# Patient Record
Sex: Male | Born: 1985 | Race: White | Hispanic: No | Smoking: Former smoker
Health system: Southern US, Community
[De-identification: ages and names within clinical notes are randomized; demographics above are authoritative.]

## PROBLEM LIST (undated history)

## (undated) DIAGNOSIS — T7840XA Allergy, unspecified, initial encounter: Secondary | ICD-10-CM

## (undated) HISTORY — DX: Allergy, unspecified, initial encounter: T78.40XA

## (undated) HISTORY — PX: COSMETIC SURGERY: SHX468

---

## 2010-12-23 ENCOUNTER — Ambulatory Visit: Payer: BC Managed Care – PPO

## 2010-12-23 ENCOUNTER — Ambulatory Visit (INDEPENDENT_AMBULATORY_CARE_PROVIDER_SITE_OTHER): Payer: BC Managed Care – PPO

## 2010-12-23 DIAGNOSIS — Z7251 High risk heterosexual behavior: Secondary | ICD-10-CM

## 2011-06-28 ENCOUNTER — Telehealth: Payer: Self-pay

## 2011-06-28 ENCOUNTER — Ambulatory Visit (INDEPENDENT_AMBULATORY_CARE_PROVIDER_SITE_OTHER): Payer: BC Managed Care – PPO | Admitting: Family Medicine

## 2011-06-28 VITALS — BP 114/70 | HR 60 | Temp 98.5°F | Resp 16 | Ht 74.5 in | Wt 170.4 lb

## 2011-06-28 DIAGNOSIS — H9202 Otalgia, left ear: Secondary | ICD-10-CM

## 2011-06-28 DIAGNOSIS — H6090 Unspecified otitis externa, unspecified ear: Secondary | ICD-10-CM

## 2011-06-28 DIAGNOSIS — Z113 Encounter for screening for infections with a predominantly sexual mode of transmission: Secondary | ICD-10-CM

## 2011-06-28 DIAGNOSIS — R221 Localized swelling, mass and lump, neck: Secondary | ICD-10-CM

## 2011-06-28 DIAGNOSIS — R220 Localized swelling, mass and lump, head: Secondary | ICD-10-CM

## 2011-06-28 DIAGNOSIS — H60399 Other infective otitis externa, unspecified ear: Secondary | ICD-10-CM

## 2011-06-28 MED ORDER — NEOMYCIN-COLIST-HC-THONZONIUM 3.3-3-10-0.5 MG/ML OT SUSP: 3.0000 [drp] | Freq: Four times a day (QID) | OTIC | Status: AC

## 2011-06-28 NOTE — Progress Notes (Signed)
This is a 26 year old man who comes in having done some river sports in the [place] followed by hiking in the [facility]. Subsequent to these activities, he developed swelling just anterior to the left ear with ear pain radiating down his neck. He's had no change in hearing or tinnitus.  Patient also was requesting STD screening. He likes to get tested every 6 months or so even though he does use protection. He has no rash or dysuria or fevers.  Objective: Mild swelling in the pretragal area of the left face which is tender as well. Examination of ear canal shows redness and some scaling, TMs are normal Oropharynx is clear of any erythema or lesions. Neck: Supple with half centimeter node at the angle of the left jaw.  Assessment: This could be months or simply otitis externa with adenopathy. STD screening will be carried out.  Plan: 1. Otitis externa  neomycin-colistin-hydrocortisone-thonzonium (CORTISPORIN-TC) 3.03-06-08-0.5 MG/ML otic suspension, Mumps antibody, IgM  2. Screening examination for venereal disease  GC/chlamydia probe amp, urine, HIV antibody  3. Otalgia of left ear    4. Left facial swelling  Mumps antibody, IgM   Recheck 48 hours if not improving

## 2011-06-28 NOTE — Telephone Encounter (Signed)
Patient saw Dr. Elbert Ewings today and he called her in cortisporin.  Pharmacy says there is not a generic for this and the patient's copay is $50.  Could we please call in something different?

## 2011-06-29 LAB — GC/CHLAMYDIA PROBE AMP, URINE
Chlamydia, Swab/Urine, PCR: NEGATIVE
GC Probe Amp, Urine: NEGATIVE

## 2011-06-29 LAB — MUMPS ANTIBODY, IGM: Mumps IgM Value: <1:20 {titer}

## 2011-06-29 LAB — HIV ANTIBODY (ROUTINE TESTING W REFLEX): HIV: NONREACTIVE

## 2011-07-25 ENCOUNTER — Ambulatory Visit (INDEPENDENT_AMBULATORY_CARE_PROVIDER_SITE_OTHER): Payer: BC Managed Care – PPO | Admitting: Family Medicine

## 2011-07-25 VITALS — BP 102/66 | HR 84 | Temp 97.6°F | Resp 16 | Ht 74.18 in | Wt 169.6 lb

## 2011-07-25 DIAGNOSIS — H60399 Other infective otitis externa, unspecified ear: Secondary | ICD-10-CM

## 2011-07-25 DIAGNOSIS — H6090 Unspecified otitis externa, unspecified ear: Secondary | ICD-10-CM

## 2011-07-25 DIAGNOSIS — H938X9 Other specified disorders of ear, unspecified ear: Secondary | ICD-10-CM

## 2011-07-25 MED ORDER — HYDROCORTISONE-ACETIC ACID 1-2 % OT SOLN: 4.0000 [drp] | Freq: Two times a day (BID) | OTIC | Status: AC

## 2011-07-25 NOTE — Progress Notes (Signed)
  [facility]:  Office Visit  Chief Complaint:  Chief Complaint  Patient presents with  . Otalgia    recurrent  left ear    HPI: TAHMID Gibson is a 26 y.o. male who complains of left ear fullness.  Was on abx for external ear infection. Denies ear pain except when burping. Denies any URI sxs. Has tried q-tips and peroxide to ear which did not seem to make it feel better. He also put some other drops accidently in his ear thinking it was his abx drops. Denies fevers, chills   Past Medical History  Diagnosis Date  . Allergy    History reviewed. No pertinent past surgical history. History   Social History  . Marital Status: Single    Spouse Name: N/A    Number of Children: N/A  . Years of Education: N/A   Social History Main Topics  . Smoking status: Former Games developer  . Smokeless tobacco: None  . Alcohol Use: Yes  . Drug Use: No  . Sexually Active: None   Other Topics Concern  . None   Social History Narrative  . None   No family history on file. Allergies  Allergen Reactions  . Sulfa Antibiotics Rash   Prior to Admission medications   Not on File     ROS: The patient denies fevers, chills, night sweats, unintentional weight loss, chest pain, palpitations, wheezing, dyspnea on exertion, nausea, vomiting, abdominal pain, dysuria, hematuria, melena, numbness, weakness, or tingling.   All other systems have been reviewed and were otherwise negative with the exception of those mentioned in the HPI and as above.    PHYSICAL EXAM: Filed Vitals:   07/25/11 1154  BP: 102/66  Pulse: 84  Temp: 97.6 F (36.4 C)  Resp: 16   Filed Vitals:   07/25/11 1154  Height: 6' 2.18" (1.884 m)  Weight: 169 lb 9.6 oz (76.93 kg)   Body mass index is 21.67 kg/(m^2).  General: Alert, no acute distress HEENT:  Normocephalic, atraumatic, oropharynx patent. Left ear: Tm intact, + MInimal redness external ear canal, + scarring, minimal fluid ?  peroxide Cardiovascular:  Regular rate and rhythm, no rubs murmurs or gallops.  No Carotid bruits, radial pulse intact. No pedal edema.  Respiratory: Clear to auscultation bilaterally.  No wheezes, rales, or rhonchi.  No cyanosis, no use of accessory musculature GI: No organomegaly, abdomen is soft and non-tender, positive bowel sounds.  No masses. Skin: No rashes. Neurologic: Facial musculature symmetric. Psychiatric: Patient is appropriate throughout our interaction. Lymphatic: No cervical lymphadenopathy Musculoskeletal: Gait intact.   LABS: Results for orders placed in visit on 06/28/11  GC/CHLAMYDIA PROBE AMP, URINE      Component Value Range   Chlamydia, Swab/Urine, PCR NEGATIVE  NEGATIVE   GC Probe Amp, Urine NEGATIVE  NEGATIVE  HIV ANTIBODY (ROUTINE TESTING)      Component Value Range   HIV NON REACTIVE  NON REACTIVE  MUMPS ANTIBODY, IGM      Component Value Range   Mumps IgM Value <1:20  <1:20 Index     EKG/XRAY:   Primary read interpreted by Dr. Conley Rolls at [facility].   ASSESSMENT/PLAN: Encounter Diagnoses  Name Primary?  . Sensation of fullness in ear Yes  . Otitis externa    Vosol Rx If no improvement then use Otic abx that was given to him prior.     Hamilton Capri PHUONG, DO 07/25/2011 12:06 PM

## 2011-10-05 ENCOUNTER — Ambulatory Visit (INDEPENDENT_AMBULATORY_CARE_PROVIDER_SITE_OTHER): Payer: BC Managed Care – PPO | Admitting: Physician Assistant

## 2011-10-05 VITALS — BP 116/84 | HR 89 | Temp 97.9°F | Resp 16 | Ht 74.0 in | Wt 166.0 lb

## 2011-10-05 DIAGNOSIS — Z9189 Other specified personal risk factors, not elsewhere classified: Secondary | ICD-10-CM

## 2011-10-05 DIAGNOSIS — Z23 Encounter for immunization: Secondary | ICD-10-CM

## 2011-10-05 DIAGNOSIS — Z202 Contact with and (suspected) exposure to infections with a predominantly sexual mode of transmission: Secondary | ICD-10-CM

## 2011-10-05 NOTE — Progress Notes (Signed)
  Subjective:    Patient ID: Dominic Gibson, male    DOB: May 20, 1985, 26 y.o.   MRN: [id number]  HPI  Dominic Gibson is a 26 yr old male here today for STI testing.  He has a new girlfriend.  She is his only sexual partner.  They were "safe in the beginning" but have been having unprotected sex since she has an IUD, and they don't have to worry about pregnancy.  He and his girlfriend were tested previously, but at that time he was not test for HIV or HSV.  Today he would like those tests as well as a full STI panel.  He denies any discharge, pain, or urinary symptoms.  He does state that he has a few small red, raised bumps on the shaft of his penis.  He believes these are just hair follicles but wants to be sure. He does not have a history of cold sores.  He may have been exposed to HSV from a previous girlfriend.   Review of Systems  Constitutional: Negative for fever, chills and fatigue.  HENT: Negative.   Eyes: Negative.   Respiratory: Negative.   Cardiovascular: Negative.   Gastrointestinal: Negative.   Genitourinary: Negative for dysuria, urgency, hematuria, discharge, difficulty urinating and testicular pain.       Objective:   Physical Exam  Constitutional: He appears well-developed and well-nourished. No distress.  Genitourinary: Penis normal. No penile erythema. No discharge found.       There are several fine, papules on either side of the shaft.  No erythema, no vessicles          Assessment & Plan:   1. Possible exposure to STD  GC/chlamydia probe amp, urine, HIV antibody, HSV(herpes simplex vrs) 1+2 ab-IgG, RPR   Mr. Dominic Gibson is a 26 yr old male here for STI screening.  He has no symptoms at this time.  He has noted some very fine papules on the side of penis.  These do not appear to be infectious.  There are no lesions to culture.  These are likely irritated hair follicles as the patient has recently trimmed/shaved.  At this time we will do a uriprobe, HIV, RPR, and HSV.  We  discussed that the results from HSV IgG/IgM will only tell us if he has ever been exposed but cannot tell us when or by whom.  The patient understands and wants to proceed with the test.  Counseled patient on condom use as a means of preventing STI.    I have reviewed the chart and agree with the treatment plan.  Rhoderick Moody, PA-C

## 2011-10-06 LAB — HSV(HERPES SIMPLEX VRS) I + II AB-IGG
HSV 1 Glycoprotein G Ab, IgG: <0.1 IV
HSV 2 Glycoprotein G Ab, IgG: <0.1 IV

## 2011-10-06 LAB — RPR

## 2011-10-06 LAB — GC/CHLAMYDIA PROBE AMP, URINE: Chlamydia, Swab/Urine, PCR: NEGATIVE

## 2011-10-07 ENCOUNTER — Telehealth: Payer: Self-pay

## 2011-10-07 NOTE — Telephone Encounter (Signed)
Patient advised labs negative

## 2011-10-07 NOTE — Telephone Encounter (Signed)
PT STATES HE RECEIVED HIS TEST RESULTS AND HAVE QUESTIONS ABOUT IT. PLEASE CALL [phone]

## 2011-10-08 ENCOUNTER — Telehealth: Payer: Self-pay | Admitting: Family Medicine

## 2011-10-08 NOTE — Telephone Encounter (Signed)
Spoke with patient explain his labs to him

## 2012-11-09 ENCOUNTER — Other Ambulatory Visit: Payer: Self-pay

## 2013-08-10 ENCOUNTER — Ambulatory Visit (INDEPENDENT_AMBULATORY_CARE_PROVIDER_SITE_OTHER): Payer: BC Managed Care – PPO | Admitting: Family Medicine

## 2013-08-10 VITALS — BP 110/70 | HR 58 | Temp 97.8°F | Resp 16 | Ht 74.0 in | Wt 177.0 lb

## 2013-08-10 DIAGNOSIS — Z0289 Encounter for other administrative examinations: Secondary | ICD-10-CM

## 2013-08-10 DIAGNOSIS — Z131 Encounter for screening for diabetes mellitus: Secondary | ICD-10-CM

## 2013-08-10 DIAGNOSIS — Z0000 Encounter for general adult medical examination without abnormal findings: Secondary | ICD-10-CM

## 2013-08-10 DIAGNOSIS — Z130 Encounter for screening for diseases of the blood and blood-forming organs and certain disorders involving the immune mechanism: Secondary | ICD-10-CM

## 2013-08-10 LAB — POCT CBC
Granulocyte percent: 60.6 %G (ref 37–80)
HEMATOCRIT: 47.1 % (ref 43.5–53.7)
Hemoglobin: 15.8 g/dL (ref 14.1–18.1)
LYMPH, POC: 1.7 (ref 0.6–3.4)
MCH: 30.7 pg (ref 27–31.2)
MCHC: 33.5 g/dL (ref 31.8–35.4)
MCV: 91.6 fL (ref 80–97)
MID (cbc): 0.4 (ref 0–0.9)
MPV: 8.4 fL (ref 0–99.8)
POC Granulocyte: 3.3 (ref 2–6.9)
POC LYMPH %: 31.5 % (ref 10–50)
POC MID %: 7.9 %M (ref 0–12)
Platelet Count, POC: 170 10*3/uL (ref 142–424)
RBC: 5.14 M/uL (ref 4.69–6.13)
RDW, POC: 13.4 %
WBC: 5.5 10*3/uL (ref 4.6–10.2)

## 2013-08-10 LAB — POCT URINALYSIS DIPSTICK
Bilirubin, UA: NEGATIVE
Glucose, UA: NEGATIVE
KETONES UA: NEGATIVE
LEUKOCYTES UA: NEGATIVE
Nitrite, UA: NEGATIVE
PH UA: 7
Protein, UA: NEGATIVE
RBC UA: NEGATIVE
Spec Grav, UA: 1.02
Urobilinogen, UA: 1

## 2013-08-10 LAB — RPR

## 2013-08-10 LAB — HIV ANTIBODY (ROUTINE TESTING W REFLEX): HIV 1&2 Ab, 4th Generation: NONREACTIVE

## 2013-08-10 NOTE — Progress Notes (Signed)
   Subjective:    Patient ID: Dominic Gibson, male    DOB: October 09, 1985, 28 y.o.   MRN: [id number]  HPI  Tuberculosis Risk Questionnaire  1. No Were you born outside the [place] in one of the following parts of the world: [place], [place], [place], [place] or [place]?    2. Yes [place] in [place] days about 3 years ago; Have you traveled outside the [place] and lived for more than one month in one of the following parts of the world: [place], [place], [place], [place] or [place]?    3. No Do you have a compromised immune system such as from any of the following conditions:HIV/AIDS, organ or bone marrow transplantation, diabetes, immunosuppressive medicines (e.g. Prednisone, Remicaide), leukemia, lymphoma, cancer of the head or neck, gastrectomy or jejunal bypass, end-stage renal disease (on dialysis), or silicosis?     4. No Have you ever or do you plan on working in: a residential care center, a health care facility, a jail or prison or homeless shelter?    5. No Have you ever: injected illegal drugs, used crack cocaine, lived in a homeless shelter  or been in jail or prison?     6. No Have you ever been exposed to anyone with infectious tuberculosis?    Tuberculosis Symptom Questionnaire  Do you currently have any of the following symptoms?  1. No Unexplained cough lasting more than 3 weeks?   2. No Unexplained fever lasting more than 3 weeks.   3. No Night Sweats (sweating that leaves the bedclothes and sheets wet)     4. No Shortness of Breath   5. No Chest Pain   6. No Unintentional weight loss    7. No Unexplained fatigue (very tired for no reason)     Review of Systems     Objective:   Physical Exam        Assessment & Plan:

## 2013-08-10 NOTE — Progress Notes (Signed)
   Subjective:    Patient ID: Dominic Gibson, male    DOB: Apr 11, 1985, 28 y.o.   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  Past Medical History  Diagnosis Date  . Allergy    Past Surgical History  Procedure Laterality Date  . Cosmetic surgery     Family History  Problem Relation Age of Onset  . Dementia Maternal Grandmother   . Parkinsonism Maternal Grandfather    History  Substance Use Topics  . Smoking status: Former Games developermoker  . Smokeless tobacco: Not on file  . Alcohol Use: Yes   Patient requests STI testing today.  Review of Systems  Allergic/Immunologic: Positive for environmental allergies and food allergies.  All other systems reviewed and are negative.      Objective:   Physical Exam  Vitals reviewed. Constitutional: He is oriented to person, place, and time. He appears well-developed and well-nourished. No distress.  HENT:  Head: Normocephalic and atraumatic.  Right Ear: Tympanic membrane, external ear and ear canal normal.  Left Ear: Tympanic membrane, external ear and ear canal normal.  Nose: Nose normal.  Mouth/Throat: Oropharynx is clear and moist. No oropharyngeal exudate.  Eyes: Conjunctivae and EOM are normal. Pupils are equal, round, and reactive to light. Right eye exhibits no discharge. Left eye exhibits no discharge.  Neck: Normal range of motion. Neck supple. No JVD present. No thyromegaly present.  Cardiovascular: Normal rate, regular rhythm and normal heart sounds.   Pulmonary/Chest: Effort normal and breath sounds normal.  Abdominal: Soft. Bowel sounds are normal. He exhibits no distension and no mass. There is no tenderness. There is no rebound and no guarding. Hernia confirmed negative in the right inguinal area and confirmed negative in the left inguinal area.  Genitourinary: Testes normal and penis normal. Right testis shows no  mass, no swelling and no tenderness. Right testis is descended. Left testis shows no mass, no swelling and no tenderness. Left testis is descended. Circumcised. No penile tenderness.  Musculoskeletal: Normal range of motion.  Lymphadenopathy:    He has no cervical adenopathy.       Right: No inguinal adenopathy present.       Left: No inguinal adenopathy present.  Neurological: He is alert and oriented to person, place, and time. He has normal reflexes.  Skin: Skin is warm and dry. He is not diaphoretic.  Psychiatric: He has a normal mood and affect. His behavior is normal. Judgment and thought content normal.      Assessment & Plan:  1. School physical exam - TB Skin Test - HIV antibody - RPR - GC/Chlamydia Probe Amp  2. Screening for deficiency anemia - POCT CBC  3. Encounter for screening examination for impaired glucose regulation and diabetes mellitus - POCT urinalysis dipstick   Emi Belfasteborah B. Gessner, FNP-BC  [facility], [facility]  08/10/2013 4:35 PM

## 2013-08-11 LAB — GC/CHLAMYDIA PROBE AMP
CT PROBE, AMP APTIMA: NEGATIVE
GC Probe RNA: NEGATIVE

## 2013-08-13 ENCOUNTER — Ambulatory Visit (INDEPENDENT_AMBULATORY_CARE_PROVIDER_SITE_OTHER): Payer: Self-pay | Admitting: *Deleted

## 2013-08-13 DIAGNOSIS — Z111 Encounter for screening for respiratory tuberculosis: Secondary | ICD-10-CM

## 2013-08-13 LAB — TB SKIN TEST: TB SKIN TEST: NEGATIVE

## 2013-08-13 NOTE — Progress Notes (Signed)
Patient here for TB read. Results negative. Patient told to come in at 11.14 am got here at 12.14pm. Ok per Porfirio Oar

## 2013-11-28 ENCOUNTER — Telehealth: Payer: Self-pay | Admitting: Family Medicine

## 2013-11-28 ENCOUNTER — Other Ambulatory Visit: Payer: Self-pay | Admitting: Radiology

## 2013-11-28 DIAGNOSIS — Z7185 Encounter for immunization safety counseling: Secondary | ICD-10-CM

## 2013-11-28 DIAGNOSIS — Z111 Encounter for screening for respiratory tuberculosis: Secondary | ICD-10-CM

## 2013-11-28 DIAGNOSIS — Z7189 Other specified counseling: Secondary | ICD-10-CM

## 2013-11-28 NOTE — Telephone Encounter (Signed)
Patients papers were reviewed he needs TB skin test and Varicella titer. Orders placed, patient called, advised. He will come in on Friday. Eunice Blase has the papers (in her drawer)

## 2013-11-28 NOTE — Telephone Encounter (Signed)
Patient dropped off form to be completed by Chad Cordial. He needs information from his physical form transcribed to another form. Please complete and call patient when ready for pick up. These papers were dropped off at [address] and given to one of the medical assistants   Thanks!

## 2013-11-28 NOTE — Telephone Encounter (Signed)
Just an FYI

## 2013-12-03 ENCOUNTER — Ambulatory Visit (INDEPENDENT_AMBULATORY_CARE_PROVIDER_SITE_OTHER): Payer: BC Managed Care – PPO | Admitting: Emergency Medicine

## 2013-12-03 ENCOUNTER — Telehealth: Payer: Self-pay | Admitting: Radiology

## 2013-12-03 VITALS — BP 112/78 | HR 83 | Temp 97.9°F | Resp 18 | Ht 74.25 in | Wt 180.0 lb

## 2013-12-03 DIAGNOSIS — Z111 Encounter for screening for respiratory tuberculosis: Secondary | ICD-10-CM

## 2013-12-03 NOTE — Telephone Encounter (Signed)
Patient is here now to see Dr Dareen Piano, I have given patient packet to patient, it was in your drawer, Dominic Gibson

## 2013-12-03 NOTE — Patient Instructions (Signed)

## 2013-12-03 NOTE — Progress Notes (Signed)
Lab only 

## 2013-12-03 NOTE — Progress Notes (Signed)
   Tuberculosis Risk Questionnaire  1. No Were you born outside the [place] in one of the following parts of the world: [place], [place], [place], [place] or [place]?    2. Yes- [place]- 45 days. Have you traveled outside the [place] and lived for more than one month in one of the following parts of the world: [place], [place], [place], [place] or [place]?    3. No Do you have a compromised immune system such as from any of the following conditions:HIV/AIDS, organ or bone marrow transplantation, diabetes, immunosuppressive medicines (e.g. Prednisone, Remicaide), leukemia, lymphoma, cancer of the head or neck, gastrectomy or jejunal bypass, end-stage renal disease (on dialysis), or silicosis?     4. No Have you ever or do you plan on working in: a residential care center, a health care facility, a jail or prison or homeless shelter?    5. No Have you ever: injected illegal drugs, used crack cocaine, lived in a homeless shelter  or been in jail or prison?     6. No Have you ever been exposed to anyone with infectious tuberculosis?    Tuberculosis Symptom Questionnaire  Do you currently have any of the following symptoms?  1. No Unexplained cough lasting more than 3 weeks?   2. No Unexplained fever lasting more than 3 weeks.   3. No Night Sweats (sweating that leaves the bedclothes and sheets wet)     4. No Shortness of Breath   5. No Chest Pain   6. No Unintentional weight loss    7. No Unexplained fatigue (very tired for no reason)    PPD placed on right forearm- 12/03/13 @ 1:55. Pt made aware to return to clinic to have read within the 48-72 hour time window.

## 2013-12-04 LAB — VARICELLA ZOSTER ANTIBODY, IGG: Varicella IgG: 2558 Index — ABNORMAL HIGH (ref <135.00)

## 2013-12-06 ENCOUNTER — Ambulatory Visit (INDEPENDENT_AMBULATORY_CARE_PROVIDER_SITE_OTHER): Payer: BC Managed Care – PPO | Admitting: Radiology

## 2013-12-06 DIAGNOSIS — Z111 Encounter for screening for respiratory tuberculosis: Secondary | ICD-10-CM

## 2013-12-06 LAB — TB SKIN TEST
INDURATION: 0 mm
TB Skin Test: NEGATIVE

## 2013-12-06 NOTE — Progress Notes (Signed)
   Subjective:    Patient ID: Dominic Gibson, male    DOB: [birth year], 28 y.o.   MRN: [id number]  HPI  Pt here for tb read   Review of Systems     Objective:   Physical Exam        Assessment & Plan:  pt

## 2015-08-20 DIAGNOSIS — Z113 Encounter for screening for infections with a predominantly sexual mode of transmission: Secondary | ICD-10-CM | POA: Diagnosis not present

## 2015-08-20 DIAGNOSIS — R300 Dysuria: Secondary | ICD-10-CM | POA: Diagnosis not present

## 2015-08-21 ENCOUNTER — Encounter (HOSPITAL_COMMUNITY): Payer: Self-pay | Admitting: Family Medicine

## 2015-08-21 ENCOUNTER — Ambulatory Visit (HOSPITAL_COMMUNITY)
Admission: EM | Admit: 2015-08-21 | Discharge: 2015-08-21 | Disposition: A | Discharge status: Home / Self Care | Payer: 59 | Attending: Family Medicine | Admitting: Family Medicine

## 2015-08-21 DIAGNOSIS — R300 Dysuria: Secondary | ICD-10-CM

## 2015-08-21 MED ORDER — DOXYCYCLINE HYCLATE 100 MG PO TABS: 100.0000 mg | ORAL_TABLET | Freq: Two times a day (BID) | ORAL | 0 refills | Status: DC

## 2015-08-21 MED FILL — PHENAZOPYRIDINE 200 MG TAB: 200 | 10 days supply | Qty: 21 | Fill #0

## 2015-08-21 NOTE — ED Triage Notes (Signed)
Pt here with urinary urgency and burning with urination x 2 weeks and worse over the past 3 days,

## 2015-08-21 NOTE — ED Provider Notes (Signed)
[facility]    CSN: [id number] Arrival date & time: 08/21/15  1910  First Provider Contact:  First MD Initiated Contact with Patient 08/21/15 1926        History   Chief Complaint Chief Complaint  Patient presents with  . Recurrent UTI    HPI Dominic Gibson is a 30 y.o. male. He works as a Engineer, civil (consulting)nurse in the ED. He comes in with 2 weeks of intermittent dysuria.  [facility] and a urine culture and STD testing were done. They prescribed Azo-Standard when his urine did not reveal any white cells.  Patient's had no new sexual contacts (is partner is been the same for 3 years) sees he's had no discharge. Been pushing fluids and taking Azo-Standard without improvement.  HPI  Past Medical History:  Diagnosis Date  . Allergy     There are no active problems to display for this patient.   Past Surgical History:  Procedure Laterality Date  . COSMETIC SURGERY         Home Medications    Prior to Admission medications   Medication Sig Start Date End Date Taking? Authorizing Provider  doxycycline (VIBRA-TABS) 100 MG tablet Take 1 tablet (100 mg total) by mouth 2 (two) times daily. 08/21/15   Elvina SidleKurt Errin Whitelaw, MD    Family History Family History  Problem Relation Age of Onset  . Dementia Maternal Grandmother   . Parkinsonism Maternal Grandfather     Social History Social History  Substance Use Topics  . Smoking status: Former Games developermoker  . Smokeless tobacco: Never Used  . Alcohol use Yes     Allergies   Sulfa antibiotics   Review of Systems Review of Systems  Constitutional: Negative.   HENT: Negative.   Eyes: Negative.   Respiratory: Negative.   Cardiovascular: Negative.   Gastrointestinal: Negative.   Genitourinary: Positive for difficulty urinating, dysuria, frequency and urgency. Negative for discharge, enuresis, flank pain, genital sores, penile pain, penile swelling, scrotal swelling and testicular pain.    Musculoskeletal: Negative.      Physical Exam Triage Vital Signs ED Triage Vitals  Enc Vitals Group     BP      Pulse      Resp      Temp      Temp src      SpO2      Weight      Height      Head Circumference      Peak Flow      Pain Score      Pain Loc      Pain Edu?      Excl. in GC?    No data found.   Updated Vital Signs BP 123/91   Pulse 96   Temp 98.5 F (36.9 C)   Resp 18   SpO2 98%   Visual Acuity Right Eye Distance:   Left Eye Distance:   Bilateral Distance:    Right Eye Near:   Left Eye Near:    Bilateral Near:     Physical Exam   UC Treatments / Results  Labs (all labs ordered are listed, but only abnormal results are displayed) Labs Reviewed - No data to display  EKG  EKG Interpretation None       Radiology No results found.  Procedures Procedures (including critical care time)  Medications Ordered in UC Medications - No data to display   Initial Impression / Assessment  and Plan / UC Course  I have reviewed the triage vital signs and the nursing notes.  Pertinent labs & imaging results that were available during my care of the patient were reviewed by me and considered in my medical decision making (see chart for details).  Clinical Course     Final Clinical Impressions(s) / UC Diagnoses   Final diagnoses:  Dysuria  Patient seems reliable and has had a complete workup which is pending. Therefore I am just prescribing antibiotics until the final results are back.  New Prescriptions New Prescriptions   DOXYCYCLINE (VIBRA-TABS) 100 MG TABLET    Take 1 tablet (100 mg total) by mouth 2 (two) times daily.     Elvina Sidle, MD 08/21/15 1944

## 2015-09-05 ENCOUNTER — Ambulatory Visit (HOSPITAL_COMMUNITY)
Admission: EM | Admit: 2015-09-05 | Discharge: 2015-09-05 | Disposition: A | Discharge status: Home / Self Care | Payer: 59 | Attending: Family Medicine | Admitting: Family Medicine

## 2015-09-05 ENCOUNTER — Encounter (HOSPITAL_COMMUNITY): Payer: Self-pay | Admitting: Emergency Medicine

## 2015-09-05 DIAGNOSIS — N410 Acute prostatitis: Secondary | ICD-10-CM

## 2015-09-05 LAB — POCT URINALYSIS DIP (DEVICE)
Bilirubin Urine: NEGATIVE
Glucose, UA: NEGATIVE mg/dL
HGB URINE DIPSTICK: NEGATIVE
KETONES UR: NEGATIVE mg/dL
Leukocytes, UA: NEGATIVE
Nitrite: NEGATIVE
PH: 7 (ref 5.0–8.0)
PROTEIN: NEGATIVE mg/dL
SPECIFIC GRAVITY, URINE: 1.015 (ref 1.005–1.030)
Urobilinogen, UA: 0.2 mg/dL (ref 0.0–1.0)

## 2015-09-05 MED ORDER — CIPROFLOXACIN HCL 500 MG PO TABS: 500.0000 mg | ORAL_TABLET | Freq: Two times a day (BID) | ORAL | 0 refills | Status: DC

## 2015-09-05 MED FILL — CIPROFLOXACIN HCL 500 MG TA: 500 | 23 days supply | Qty: 45 | Fill #0

## 2015-09-05 NOTE — ED Provider Notes (Signed)
CSN: [id number]     Arrival date & time 09/05/15  1437 History   First MD Initiated Contact with Patient 09/05/15 1526     Chief Complaint  Patient presents with  . 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 He went to an urgent care presuming a UTI in the urinalysis was clear as well as the follow-up culture. He had been placed on Azo to help his symptoms. He states that he did help some. Short time later, 1-2 days after not feeling better he presented to this urgent care and was placed on doxycycline. He completed that course of 10 days. He states that some of the pain was better but he continued to have intermittent painful ejaculation and some tenderness to the scrotum. He once in a while he would have an urge to have a BM. He also complains of intermittent nocturia. Currently denies dysuria.      Past Medical History:  Diagnosis Date  . Allergy    Past Surgical History:  Procedure Laterality Date  . COSMETIC SURGERY     Family History  Problem Relation Age of Onset  . Dementia Maternal Grandmother   . Parkinsonism Maternal Grandfather    Social History  Substance Use Topics  . Smoking status: Former Games developermoker  . Smokeless tobacco: Never Used  . Alcohol use Yes    Review of Systems  Constitutional: Negative.  Negative for activity change, fatigue and fever.  HENT: Negative.   Eyes: Negative.   Respiratory: Negative.   Gastrointestinal: Negative.   Genitourinary: Positive for dysuria and frequency. Negative for discharge, flank pain, penile pain, penile swelling, scrotal swelling and urgency.  Musculoskeletal: Negative.   Skin: Negative.   Neurological: Negative.   All other systems reviewed and are negative.   Allergies   Sulfa antibiotics  Home Medications   Prior to Admission medications   Medication Sig Start Date End Date Taking? Authorizing Provider  ciprofloxacin (CIPRO) 500 MG tablet Take 1 tablet (500 mg total) by mouth 2 (two) times daily. 09/05/15   Hayden Rasmussenavid Katharine Rochefort, NP   Meds Ordered and Administered this Visit  Medications - No data to display  BP 117/78 (BP Location: Left Arm)   Pulse 76   Temp 98.4 F (36.9 C)   Resp 16   SpO2 99%  No data found.   Physical Exam  Constitutional: He is oriented to person, place, and time. He appears well-developed and well-nourished. No distress.  Eyes: EOM are normal.  Neck: Normal range of motion. Neck supple.  Cardiovascular: Normal rate.   Pulmonary/Chest: Effort normal. No respiratory distress.  Genitourinary: Rectum normal and penis normal.  Genitourinary Comments: Prostate of normal size however soft and boggy. Mildly tender but not directly painful. Normal sphincter tone. No evidence of bleeding.  Genital exam reveals normal external male genitalia. Bilateral testicles descended. No enlargement. Palpation of the epididymis and testicles reveal no tenderness at this time. No discoloration, swelling.  Musculoskeletal: Normal range of motion. He exhibits no edema or deformity.  Neurological: He is alert and oriented to person, place, and time. He exhibits normal muscle tone.  Skin: Skin is warm and dry.  Psychiatric: He has a normal mood and affect.  Nursing note and vitals reviewed.   Urgent Care Course   Clinical Course    Procedures (including critical  care time)  Labs Review Labs Reviewed  POCT URINALYSIS DIP (DEVICE)   Results for orders placed or performed during the hospital encounter of 09/05/15  POCT urinalysis dip (device)  Result Value Ref Range   Glucose, UA NEGATIVE NEGATIVE mg/dL   Bilirubin Urine NEGATIVE NEGATIVE   Ketones, ur NEGATIVE NEGATIVE mg/dL   Specific Gravity, Urine 1.015 1.005 - 1.030   Hgb urine dipstick  NEGATIVE NEGATIVE   pH 7.0 5.0 - 8.0   Protein, ur NEGATIVE NEGATIVE mg/dL   Urobilinogen, UA 0.2 0.0 - 1.0 mg/dL   Nitrite NEGATIVE NEGATIVE   Leukocytes, UA NEGATIVE NEGATIVE     Imaging Review No results found.   Visual Acuity Review  Right Eye Distance:   Left Eye Distance:   Bilateral Distance:    Right Eye Near:   Left Eye Near:    Bilateral Near:         MDM   1. Acute prostatitis    Exam of the genitals does not suggest epididymitis at this time. Likely prostatitis. He has been treated with a ten-day course of doxycycline with modest improvement. will treat with Cipro. Patient has been advised of complications involving C. difficile. The patient requests referral to urologist and this was given. If not improved after this course of Cipro he should follow-up, make an appointment. For any worsening new symptoms or problems return or follow-up with PCP. Drink plenty fluids and stay well-hydrated. Meds ordered this encounter  Medications  . ciprofloxacin (CIPRO) 500 MG tablet    Sig: Take 1 tablet (500 mg total) by mouth 2 (two) times daily.    Dispense:  45 tablet    Refill:  0    Order Specific Question:   Supervising Provider    Answer:   Linna Hoff [[id number]]       Hayden Rasmussen, NP 09/05/15 1629    Hayden Rasmussen, NP 09/05/15 1630

## 2015-09-05 NOTE — ED Triage Notes (Signed)
Here for a f/u from 8/17... Dx w/dysuria  Sx today include: urinary freq, painful ejaculation and groin tenderness  Reports she went to PCP and UA was clear and also GC/Chlam   A&O x4... NAD

## 2015-09-14 ENCOUNTER — Telehealth: Payer: 59 | Admitting: Nurse Practitioner

## 2015-09-14 DIAGNOSIS — B370 Candidal stomatitis: Secondary | ICD-10-CM | POA: Diagnosis not present

## 2015-09-14 DIAGNOSIS — B3781 Candidal esophagitis: Secondary | ICD-10-CM

## 2015-09-14 MED ORDER — NYSTATIN 100000 UNIT/ML MT SUSP: 5.0000 mL | Freq: Four times a day (QID) | OROMUCOSAL | 0 refills | Status: DC

## 2015-09-14 NOTE — Progress Notes (Signed)
Sorry to here you are not feeling well- you have thrush- a fungal infection  In the mouth. I sent in rx for nyastatin- use as directed

## 2015-09-15 MED FILL — NYSTATIN 100,000 UNITS/ML S: 100000 | 3 days supply | Qty: 60 | Fill #0

## 2015-10-07 ENCOUNTER — Ambulatory Visit (INDEPENDENT_AMBULATORY_CARE_PROVIDER_SITE_OTHER): Payer: 59 | Admitting: Urology

## 2015-10-07 DIAGNOSIS — R102 Pelvic and perineal pain: Secondary | ICD-10-CM | POA: Diagnosis not present

## 2015-10-07 MED FILL — ALFUZOSIN HCL ER 10 MG TAB: 10 | 30 days supply | Qty: 30 | Fill #0

## 2015-10-14 ENCOUNTER — Encounter: Payer: Self-pay | Admitting: Physician Assistant

## 2015-10-14 DIAGNOSIS — R102 Pelvic and perineal pain: Secondary | ICD-10-CM | POA: Insufficient documentation

## 2015-10-30 MED FILL — CYCLOBENZAPRINE 10 MG TAB: 10 | 10 days supply | Qty: 30 | Fill #0

## 2015-11-10 MED FILL — ALFUZOSIN HCL ER 10 MG TAB: 10 | 30 days supply | Qty: 30 | Fill #1

## 2015-11-19 DIAGNOSIS — R102 Pelvic and perineal pain: Secondary | ICD-10-CM | POA: Diagnosis not present

## 2015-11-19 MED FILL — hydrOXYzine HCL 25 MG TABS: 25 | 90 days supply | Qty: 90 | Fill #0

## 2015-11-28 ENCOUNTER — Telehealth: Payer: 59 | Admitting: Family

## 2015-11-28 DIAGNOSIS — R300 Dysuria: Secondary | ICD-10-CM | POA: Diagnosis not present

## 2015-11-28 NOTE — Progress Notes (Signed)
We are sorry that you are not feeling well.  Here is how we plan to help!  Male bladder infections are not very common.  We worry about prostate or kidney conditions.  The standard of care is to examine the abdomen and kidneys, and to do a urine and blood test to make sure that something more serious is not going on.  We recommend that you see a provider today.  If your doctor's office is closed [facility] has the following Urgent Cares:  Due to your situation being much more complicated and with a medication that was given as a sample and not truly prescribed, the decision to continue prescribing it needs to be made by [facility] or face-to-face. You do not have the specific symptoms for a UTI and, as you said, this is likely from the underlying condition you already have. I wish I had a better response. You can consider going face-to-face at our instacare clinic below or wait until Monday.   If you need care fast and have a high deductible or no insurance consider:   [email or web address]  [phone]  [address] [place], [place] [address] 8 am to 8 pm Monday-Friday 10 am to 4 pm Saturday-Sunday   The following sites will take your  insurance:    . [facility]  [phone] Get Driving Directions Find a Provider at this Location  [address] [place], [place] [address] . 10 am to 8 pm Monday-Friday . 12 pm to 8 pm Saturday-Sunday   . [facility]  [phone] Get Driving Directions Find a Provider at this Location  [address] [place], [place] [address] . 8 am to 8 pm Monday-Friday . 9 am to 6 pm Saturday . 11 am to 6 pm Sunday   . [facility]  [phone] Get Driving Directions  [address] [place], [place] [address] . 8 am to 8 pm Monday-Friday . 8 am to 4 pm Saturday-Sunday   . [facility]  (a walk in primary care provider)  [phone]  Get Driving Directions Find a Provider at this Location  [address] [place], [place] [address] . 8 am to 8:30 pm Monday-Thursday . 8 am to 6 pm Friday . 8 am to 4 pm Saturday-Sunday   Your e-visit answers were reviewed by a board certified advanced clinical practitioner to complete your personal care plan.  Thank you for using e-Visits.

## 2015-12-02 DIAGNOSIS — R300 Dysuria: Secondary | ICD-10-CM | POA: Diagnosis not present

## 2015-12-02 DIAGNOSIS — R278 Other lack of coordination: Secondary | ICD-10-CM | POA: Diagnosis not present

## 2015-12-02 DIAGNOSIS — M62838 Other muscle spasm: Secondary | ICD-10-CM | POA: Diagnosis not present

## 2015-12-02 DIAGNOSIS — R102 Pelvic and perineal pain: Secondary | ICD-10-CM | POA: Diagnosis not present

## 2015-12-05 DIAGNOSIS — F988 Other specified behavioral and emotional disorders with onset usually occurring in childhood and adolescence: Secondary | ICD-10-CM | POA: Diagnosis not present

## 2015-12-05 DIAGNOSIS — G43109 Migraine with aura, not intractable, without status migrainosus: Secondary | ICD-10-CM | POA: Diagnosis not present

## 2015-12-05 DIAGNOSIS — G4726 Circadian rhythm sleep disorder, shift work type: Secondary | ICD-10-CM | POA: Diagnosis not present

## 2015-12-05 DIAGNOSIS — N3010 Interstitial cystitis (chronic) without hematuria: Secondary | ICD-10-CM | POA: Diagnosis not present

## 2015-12-05 DIAGNOSIS — Z23 Encounter for immunization: Secondary | ICD-10-CM | POA: Diagnosis not present

## 2015-12-05 MED FILL — ALFUZOSIN HCL ER 10 MG TAB: 10 | 30 days supply | Qty: 30 | Fill #2

## 2015-12-12 DIAGNOSIS — R102 Pelvic and perineal pain: Secondary | ICD-10-CM | POA: Diagnosis not present

## 2015-12-12 DIAGNOSIS — R300 Dysuria: Secondary | ICD-10-CM | POA: Diagnosis not present

## 2015-12-12 DIAGNOSIS — M62838 Other muscle spasm: Secondary | ICD-10-CM | POA: Diagnosis not present

## 2015-12-17 MED FILL — ZOLPIDEM TARTRATE 10 MG TAB: 10 | 20 days supply | Qty: 20 | Fill #0

## 2015-12-18 DIAGNOSIS — M6281 Muscle weakness (generalized): Secondary | ICD-10-CM | POA: Diagnosis not present

## 2015-12-18 DIAGNOSIS — R300 Dysuria: Secondary | ICD-10-CM | POA: Diagnosis not present

## 2015-12-18 DIAGNOSIS — M62838 Other muscle spasm: Secondary | ICD-10-CM | POA: Diagnosis not present

## 2015-12-18 DIAGNOSIS — R102 Pelvic and perineal pain: Secondary | ICD-10-CM | POA: Diagnosis not present

## 2015-12-31 DIAGNOSIS — M62838 Other muscle spasm: Secondary | ICD-10-CM | POA: Diagnosis not present

## 2015-12-31 DIAGNOSIS — M6281 Muscle weakness (generalized): Secondary | ICD-10-CM | POA: Diagnosis not present

## 2015-12-31 DIAGNOSIS — R102 Pelvic and perineal pain: Secondary | ICD-10-CM | POA: Diagnosis not present

## 2015-12-31 DIAGNOSIS — R3982 Chronic bladder pain: Secondary | ICD-10-CM | POA: Diagnosis not present

## 2016-01-14 MED FILL — ALFUZOSIN HCL ER 10 MG TAB: 10 | 30 days supply | Qty: 30 | Fill #3

## 2016-01-28 DIAGNOSIS — M6281 Muscle weakness (generalized): Secondary | ICD-10-CM | POA: Diagnosis not present

## 2016-01-28 DIAGNOSIS — R102 Pelvic and perineal pain: Secondary | ICD-10-CM | POA: Diagnosis not present

## 2016-01-28 DIAGNOSIS — R3982 Chronic bladder pain: Secondary | ICD-10-CM | POA: Diagnosis not present

## 2016-01-28 DIAGNOSIS — M62838 Other muscle spasm: Secondary | ICD-10-CM | POA: Diagnosis not present

## 2016-02-09 MED FILL — ALFUZOSIN HCL ER 10 MG TAB: 10 | 30 days supply | Qty: 30 | Fill #4

## 2016-02-09 MED FILL — hydrOXYzine HCL 25 MG TABS: 25 | 90 days supply | Qty: 90 | Fill #1

## 2016-02-20 DIAGNOSIS — R102 Pelvic and perineal pain: Secondary | ICD-10-CM | POA: Diagnosis not present

## 2016-03-09 MED FILL — DEXTROAMP-AMPHETAMIN 10 MG: 10 | 30 days supply | Qty: 60 | Fill #0

## 2016-03-22 MED FILL — ALFUZOSIN HCL ER 10 MG TAB: 10 | 30 days supply | Qty: 30 | Fill #5

## 2016-04-15 MED FILL — AMPHETAMINE SALTS 10 MG TAB: 10 | 30 days supply | Qty: 60 | Fill #0

## 2016-05-05 MED FILL — ALFUZOSIN HCL ER 10 MG TAB: 10 | 30 days supply | Qty: 30 | Fill #6

## 2016-05-20 MED FILL — DEXTROAMP-AMP 10 MG TAB: 10 | 30 days supply | Qty: 30 | Fill #0

## 2016-05-20 MED FILL — DEXTROAMP-AMPHETAMIN 20 MG: 20 | 30 days supply | Qty: 30 | Fill #0

## 2016-05-21 MED FILL — ZOLPIDEM TARTRATE 10 MG TAB: 10 | 20 days supply | Qty: 20 | Fill #0

## 2016-06-01 MED FILL — ALFUZOSIN HCL ER 10 MG TAB: 10 | 30 days supply | Qty: 30 | Fill #7

## 2016-06-01 MED FILL — hydrOXYzine HCL 25 MG TABS: 25 | 90 days supply | Qty: 90 | Fill #2

## 2016-06-08 DIAGNOSIS — R102 Pelvic and perineal pain: Secondary | ICD-10-CM | POA: Diagnosis not present

## 2016-06-08 MED FILL — AMITRIPTYLINE HCL 10 MG TAB: 10 | 30 days supply | Qty: 30 | Fill #0

## 2016-06-08 MED FILL — diazePAM 10 MG TABS: 10 | 10 days supply | Qty: 10 | Fill #0

## 2016-06-09 DIAGNOSIS — M9903 Segmental and somatic dysfunction of lumbar region: Secondary | ICD-10-CM | POA: Diagnosis not present

## 2016-06-09 DIAGNOSIS — M25552 Pain in left hip: Secondary | ICD-10-CM | POA: Diagnosis not present

## 2016-06-09 DIAGNOSIS — M9905 Segmental and somatic dysfunction of pelvic region: Secondary | ICD-10-CM | POA: Diagnosis not present

## 2016-06-09 DIAGNOSIS — Q72812 Congenital shortening of left lower limb: Secondary | ICD-10-CM | POA: Diagnosis not present

## 2016-06-09 DIAGNOSIS — M9904 Segmental and somatic dysfunction of sacral region: Secondary | ICD-10-CM | POA: Diagnosis not present

## 2016-06-09 DIAGNOSIS — M5386 Other specified dorsopathies, lumbar region: Secondary | ICD-10-CM | POA: Diagnosis not present

## 2016-06-10 DIAGNOSIS — Q72812 Congenital shortening of left lower limb: Secondary | ICD-10-CM | POA: Diagnosis not present

## 2016-06-10 DIAGNOSIS — M5386 Other specified dorsopathies, lumbar region: Secondary | ICD-10-CM | POA: Diagnosis not present

## 2016-06-10 DIAGNOSIS — M9904 Segmental and somatic dysfunction of sacral region: Secondary | ICD-10-CM | POA: Diagnosis not present

## 2016-06-10 DIAGNOSIS — M9903 Segmental and somatic dysfunction of lumbar region: Secondary | ICD-10-CM | POA: Diagnosis not present

## 2016-06-10 DIAGNOSIS — M9905 Segmental and somatic dysfunction of pelvic region: Secondary | ICD-10-CM | POA: Diagnosis not present

## 2016-06-10 DIAGNOSIS — M25552 Pain in left hip: Secondary | ICD-10-CM | POA: Diagnosis not present

## 2016-06-11 DIAGNOSIS — Z23 Encounter for immunization: Secondary | ICD-10-CM | POA: Diagnosis not present

## 2016-06-11 DIAGNOSIS — M545 Low back pain: Secondary | ICD-10-CM | POA: Diagnosis not present

## 2016-06-11 DIAGNOSIS — G4726 Circadian rhythm sleep disorder, shift work type: Secondary | ICD-10-CM | POA: Diagnosis not present

## 2016-06-11 DIAGNOSIS — F988 Other specified behavioral and emotional disorders with onset usually occurring in childhood and adolescence: Secondary | ICD-10-CM | POA: Diagnosis not present

## 2016-06-11 DIAGNOSIS — M6289 Other specified disorders of muscle: Secondary | ICD-10-CM | POA: Diagnosis not present

## 2016-06-18 MED FILL — CYCLOBENZAPRINE 10 MG TAB: 10 | 8 days supply | Qty: 20 | Fill #0

## 2016-06-18 MED FILL — DEXTROAMP-AMPHETAMIN 20 MG: 20 | 30 days supply | Qty: 45 | Fill #0

## 2016-06-29 ENCOUNTER — Ambulatory Visit (INDEPENDENT_AMBULATORY_CARE_PROVIDER_SITE_OTHER): Payer: 59 | Admitting: Family Medicine

## 2016-06-29 ENCOUNTER — Encounter: Payer: Self-pay | Admitting: Family Medicine

## 2016-06-29 VITALS — BP 103/68 | HR 61 | Ht 75.0 in | Wt 183.0 lb

## 2016-06-29 DIAGNOSIS — S39012A Strain of muscle, fascia and tendon of lower back, initial encounter: Secondary | ICD-10-CM

## 2016-06-29 DIAGNOSIS — M545 Low back pain: Secondary | ICD-10-CM

## 2016-06-29 DIAGNOSIS — G8929 Other chronic pain: Secondary | ICD-10-CM

## 2016-06-29 NOTE — Patient Instructions (Addendum)
Your exam is reassuring. This is consistent either with a deep lumbar strain (to erector spinae, the posture muscles) or sprain of ligaments deep in lumbar spine. Physical therapy is very important - do home exercises on days you don't go to therapy. Any medications you take would just be for pain at this point as needed: Tylenol 500mg  1-2 tabs three times a day for pain. Aleve 1-2 tabs twice a day with food Capsaicin, aspercreme, or biofreeze topically up to four times a day may also help with pain. Some supplements that may help (thought data in these is for arthritis, short term pain relief): Boswellia extract, curcumin, pycnogenol Hamstring stretching will help - hold for 20-30 seconds, repeat 3 times. Shoe inserts with good arch support may be helpful. Heat or ice 15 minutes at a time 3-4 times a day as needed to help with pain. If not improving, will consider further imaging (x-rays with obliques, MRI). Follow up with me in 1 month to 6 weeks.

## 2016-06-30 DIAGNOSIS — M545 Low back pain, unspecified: Secondary | ICD-10-CM | POA: Insufficient documentation

## 2016-06-30 NOTE — Progress Notes (Signed)
PCP: Maurice Small, MD  Subjective:   HPI: Patient is a 31 y.o. male here for low back pain.  Patient reports he's had about 9 months of low back pain. Reports back then he caught a falling patient and felt a loud pop in middle of low back. Pain has been constant since then, worse by end of day. Has tried chiropractic care, advil, occasional flexeril, back brace. Pain is 0/10 at rest, up to 5/10 and sharp at worst. He does regular yoga for pelvic floor also. Had x-rays by chiropractor - told only slight curvature but no other abnormalities. No radiation into legs. No numbness/tingling. No bowel/bladder dysfunction.  Past Medical History:  Diagnosis Date  . Allergy     No current outpatient prescriptions on file prior to visit.   No current facility-administered medications on file prior to visit.     Past Surgical History:  Procedure Laterality Date  . COSMETIC SURGERY      Allergies  Allergen Reactions  . Shellfish-Derived Products Other (See Comments)  . Sulfa Antibiotics Rash    Social History   Social History  . Marital status: Single    Spouse name: N/A  . Number of children: N/A  . Years of education: N/A   Occupational History  . Not on file.   Social History Main Topics  . Smoking status: Former Games developer  . Smokeless tobacco: Never Used  . Alcohol use Yes  . Drug use: No  . Sexual activity: Not on file   Other Topics Concern  . Not on file   Social History Narrative  . No narrative on file    Family History  Problem Relation Age of Onset  . Dementia Maternal Grandmother   . Parkinsonism Maternal Grandfather     BP 103/68   Pulse 61   Ht 6\' 3"  (1.905 m)   Wt 183 lb (83 kg)   BMI 22.87 kg/m   Review of Systems: See HPI above.     Objective:  Physical Exam:  Gen: NAD, comfortable in exam room  Back: No gross deformity, scoliosis. No paraspinal TTP .  No midline or bony TTP. FROM with slight pain on extension. Strength LEs  5/5 all muscle groups except 5-/5 with bilateral hip abduction. 2+ MSRs in patellar and achilles tendons, equal bilaterally. Negative SLRs. Mild hamstring tightness. Sensation intact to light touch bilaterally. Negative logroll bilateral hips Negative fabers and piriformis stretches.   Assessment & Plan:  1. Low back pain - started with acute injury 9 months ago.  Consistent with either deep strain or lumbar sprain.  Has not tried physical therapy yet - will start this and do home exercises on days he doesn't go to therapy.  Consider tylenol, aleve, topical medications, supplements which were all reviewed.  Hamstring stretching.  Inserts, heat/ice.  F/u in 1 month to 6 weeks for reevaluation.  Consider imaging if not improving.

## 2016-06-30 NOTE — Assessment & Plan Note (Signed)
started with acute injury 9 months ago.  Consistent with either deep strain or lumbar sprain.  Has not tried physical therapy yet - will start this and do home exercises on days he doesn't go to therapy.  Consider tylenol, aleve, topical medications, supplements which were all reviewed.  Hamstring stretching.  Inserts, heat/ice.  F/u in 1 month to 6 weeks for reevaluation.  Consider imaging if not improving.

## 2016-07-08 MED FILL — ALFUZOSIN HCL ER 10 MG TAB: 10 | 30 days supply | Qty: 30 | Fill #8

## 2016-07-08 MED FILL — AMITRIPTYLINE HCL 10 MG TAB: 10 | 30 days supply | Qty: 30 | Fill #1

## 2016-07-13 ENCOUNTER — Ambulatory Visit: Payer: 59 | Attending: Family Medicine | Admitting: Physical Therapy

## 2016-07-13 DIAGNOSIS — M545 Low back pain: Secondary | ICD-10-CM | POA: Diagnosis not present

## 2016-07-13 DIAGNOSIS — G8929 Other chronic pain: Secondary | ICD-10-CM | POA: Diagnosis not present

## 2016-07-13 DIAGNOSIS — M6281 Muscle weakness (generalized): Secondary | ICD-10-CM | POA: Diagnosis not present

## 2016-07-13 NOTE — Therapy (Signed)
[facility] [address] [place], [place], [address] Phone: [phone]   Fax:  [phone]  Physical Therapy Evaluation  Patient Details  Name: Dominic Gibson MRN: [id number] Date of Birth: 1985-06-23 Referring Provider: Dr. Pearletha Forge  Encounter Date: 07/13/2016      PT End of Session - 07/13/16 2108    Visit Number 1   Date for PT Re-Evaluation 09/07/16   Authorization Type UMR   PT Start Time 1230   PT Stop Time 1320   PT Time Calculation (min) 50 min   Activity Tolerance Patient tolerated treatment well      Past Medical History:  Diagnosis Date  . Allergy     Past Surgical History:  Procedure Laterality Date  . COSMETIC SURGERY      There were no vitals filed for this visit.       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   Got new shoes and stopped wearing back brace.  No LE symptoms.  Right > left low back pain.   Used to be a Veterinary surgeon and runner.     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 not limited   How long can you walk comfortably? unlimited   Diagnostic tests X-ray showed "thinning of discs"    Patient Stated Goals I don't want to hurt myself;  strengthen some muscles up   Currently in Pain? Yes   Pain Score 0-No pain   Pain Location Back   Pain Type Chronic pain   Pain Onset More than a month ago   Pain Frequency Intermittent   Aggravating Factors  lifting ;  bending over   Pain Relieving Factors stand with foot propped in cabinet;  firm mattress;  Flexeril            [facility] PT Assessment  - 07/13/16 0001      Assessment   Medical Diagnosis lumbar strain   Referring Provider Dr. Pearletha Forge   Onset Date/Surgical Date --  Oct 2017   Hand Dominance Right   Next MD Visit early Aug    Prior Therapy for pelvic floor     Precautions   Precautions None     Restrictions   Weight Bearing Restrictions No     Balance Screen   Has the patient fallen in the past 6 months No   Has the patient had a decrease in activity level because of a fear of falling?  No   Is the patient reluctant to leave their home because of a fear of falling?  No     Home Tourist information centre manager residence     Prior Function   Level of Independence Independent   Vocation Full time employment   Vocation Requirements ED nurse   Leisure go for walks with dog;  hang out with friends, work a lot of overtime     Observation/Other Assessments   Focus on Therapeutic Outcomes (FOTO)  38% limitation      Posture/Postural Control   Posture/Postural Control Postural  limitations   Postural Limitations Decreased lumbar lordosis   Posture Comments No shift     AROM   Lumbar Flexion 40   Lumbar Extension 35   Lumbar - Right Side Bend 40   Lumbar - Left Side Bend 40     Strength   Strength Assessment Site Hip   Right/Left Hip Right;Left   Right Hip Extension 5/5   Right Hip ABduction 5/5   Left Hip Extension 5/5   Lumbar Flexion 4-/5   Lumbar Extension 4-/5     Flexibility   Soft Tissue Assessment /Muscle Length yes   Hamstrings bil decreased length     Palpation   Palpation comment increased muscle tone right lumbar paraspinal     Slump test   Findings Negative     Prone Knee Bend Test   Findings Negative     Straight Leg Raise   Findings Negative            Objective measurements completed on examination: See above findings.                  PT Education - 07/13/16 2108    Education provided Yes   Education Details prone press ups, standing  extension; TENS unit info; basic lifting principles   Person(s) Educated Patient   Methods Explanation;Demonstration;Handout   Comprehension Verbalized understanding;Returned demonstration          PT Short Term Goals - 07/13/16 2118      PT SHORT TERM GOAL #1   Title The patient will report an understanding of initial HEP and knowledge of basic postural correction and body mechanics for work  08/10/16   Time 4   Period Weeks   Status New     PT SHORT TERM GOAL #2   Title Lumbar flexion ROM to 50 degrees needed for greater mobility for work duties as an ED work   Time 4   Period Weeks   Status New     PT SHORT TERM GOAL #3   Title The patient will report ability to do moderate lifting at work 30# with minimal pain   Time 4   Period Weeks   Status New           PT Long Term Goals - 07/13/16 2121      PT LONG TERM GOAL #1   Title The patient will be independent in safe self progression of HEP needed for further improvements in core strength   09/07/16   Time 8   Period Weeks   Status New     PT LONG TERM GOAL #2   Title The patient will have grossly 4 to 4+/5 core strength needed for lifting patients at work   Time 8   Period Weeks   Status New     PT LONG TERM GOAL #3   Title Patient will report an overall improvement of pain with lifting 40# or more    Time 8   Period Weeks   Status New     PT LONG TERM GOAL #4   Title FOTO functional outcome score improved from 38% limitation to 23% indicating improved function with less pain   Time 8   Period Weeks   Status New                Plan - 07/13/16 2109    Clinical Impression Statement The patient reports low back pain which started last October 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  He reports right > left LBP without LE symptoms.  He states his symptoms have improved but he continues to have pain with even light lifting < 20 pounds.  He avoids moving/lifting patients as much as possible  at work.  Lumbar ROM limited in flexion.  Repeated movement testing is inconclusive for directional preference.  Decreased HS length bilaterally.  Decreased lower abdominal and lumbar multifidi activation.  No neural signs.  Decreased lumbar lordosis.  Right lumbar paraspinal increased muscle tone.  He would benefit from additional education on lifting principles and instruction on core strengthening to assist with lifting and return to higher level recreational activities including running and rock climbing.     History and Personal Factors relevant to plan of care: pelvic floor dysfunction which responded well to PT   Clinical Presentation Stable   Clinical Presentation due to: low back pain   Clinical Decision Making Low   Rehab Potential Good   Clinical Impairments Affecting Rehab Potential none   PT Frequency 2x / week   PT Duration 8 weeks   PT Treatment/Interventions ADLs/Self Care Home Management;Electrical Stimulation;Cryotherapy;Moist Heat;Traction;Ultrasound;Patient/family education;Neuromuscular re-education;Therapeutic exercise;Therapeutic activities;Manual techniques;Taping;Dry needling   PT Next Visit Plan review press ups;  add bird dogs, abdominal brace progression;  multifidi muscle activation without gluteals;  body mechanics training;  DN to right lumbar paraspinal if needed   Recommended Other Services home TENS unit for pain control   Consulted and Agree with Plan of Care Patient      Patient will benefit from skilled therapeutic intervention in order to improve the following deficits and impairments:  Pain, Decreased strength, Decreased range of motion, Improper body mechanics, Postural dysfunction  Visit Diagnosis: Chronic bilateral low back pain without sciatica - Plan: PT plan of care cert/re-cert  Muscle weakness (generalized) - Plan: PT plan of care cert/re-cert     Problem List Patient Active Problem List   Diagnosis Date Noted  . Low back pain 06/30/2016  .  Pelvic pain in male 10/14/2015    Vivien Presto 07/13/2016, 9:27 PM  [facility] [address] [place], [place], [address] Phone: [phone]   Fax:  [phone]  Name: Dominic Gibson MRN: [id number] Date of Birth: Jan 25, 1985

## 2016-07-13 NOTE — Therapy (Signed)
[facility] [address], [address] [place], [place], [address] Phone: [phone]   Fax:  [phone]  Physical Therapy Evaluation  Patient Details  Name: Dominic Gibson MRN: [id number] Date of Birth: [birth year] Referring Provider: Dr. Pearletha Forge  Encounter Date: 07/13/2016      PT End of Session - 07/13/16 2108    Visit Number 1   Date for PT Re-Evaluation 09/07/16   Authorization Type UMR   PT Start Time 1230   PT Stop Time 1320   PT Time Calculation (min) 50 min   Activity Tolerance Patient tolerated treatment well      Past Medical History:  Diagnosis Date  . Allergy     Past Surgical History:  Procedure Laterality Date  . COSMETIC SURGERY      There were no vitals filed for this visit.       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   Got new shoes and stopped wearing back brace.  No LE symptoms.  Right > left low back pain.   Used to be a Veterinary surgeon and runner.     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 not limited   How long can you walk comfortably? unlimited   Diagnostic tests X-ray showed "thinning of discs"    Patient Stated Goals I don't want to hurt myself;  strengthen some muscles up   Currently in Pain? Yes   Pain Score 0-No pain   Pain Location Back   Pain Type Chronic pain   Pain Onset More than a month ago   Pain Frequency Intermittent   Aggravating Factors  lifting ;  bending over   Pain Relieving Factors stand with foot propped in cabinet;  firm mattress;  Flexeril            [facility] PT Assessment  - 07/13/16 0001      Assessment   Medical Diagnosis lumbar strain   Referring Provider Dr. Pearletha Forge   Onset Date/Surgical Date --  Oct 2017   Hand Dominance Right   Next MD Visit early Aug    Prior Therapy for pelvic floor     Precautions   Precautions None     Restrictions   Weight Bearing Restrictions No     Balance Screen   Has the patient fallen in the past 6 months No   Has the patient had a decrease in activity level because of a fear of falling?  No   Is the patient reluctant to leave their home because of a fear of falling?  No     Home Tourist information centre manager residence     Prior Function   Level of Independence Independent   Vocation Full time employment   Vocation Requirements ED nurse   Leisure go for walks with dog;  hang out with friends, work a lot of overtime     Observation/Other Assessments   Focus on Therapeutic Outcomes (FOTO)  38% limitation      Posture/Postural Control   Posture/Postural Control Postural  limitations   Postural Limitations Decreased lumbar lordosis   Posture Comments No shift     AROM   Lumbar Flexion 40   Lumbar Extension 35   Lumbar - Right Side Bend 40   Lumbar - Left Side Bend 40     Strength   Strength Assessment Site Hip   Right/Left Hip Right;Left   Right Hip Extension 5/5   Right Hip ABduction 5/5   Left Hip Extension 5/5   Lumbar Flexion 4-/5   Lumbar Extension 4-/5     Flexibility   Soft Tissue Assessment /Muscle Length yes   Hamstrings bil decreased length     Palpation   Palpation comment increased muscle tone right lumbar paraspinal     Slump test   Findings Negative     Prone Knee Bend Test   Findings Negative     Straight Leg Raise   Findings Negative            Objective measurements completed on examination: See above findings.                  PT Education - 07/13/16 2108    Education provided Yes   Education Details prone press ups, standing  extension; TENS unit info; basic lifting principles   Person(s) Educated Patient   Methods Explanation;Demonstration;Handout   Comprehension Verbalized understanding;Returned demonstration          PT Short Term Goals - 07/13/16 2118      PT SHORT TERM GOAL #1   Title The patient will report an understanding of initial HEP and knowledge of basic postural correction and body mechanics for work  08/10/16   Time 4   Period Weeks   Status New     PT SHORT TERM GOAL #2   Title Lumbar flexion ROM to 50 degrees needed for greater mobility for work duties as an ED work   Time 4   Period Weeks   Status New     PT SHORT TERM GOAL #3   Title The patient will report ability to do moderate lifting at work 30# with minimal pain   Time 4   Period Weeks   Status New           PT Long Term Goals - 07/13/16 2121      PT LONG TERM GOAL #1   Title The patient will be independent in safe self progression of HEP needed for further improvements in core strength   09/07/16   Time 8   Period Weeks   Status New     PT LONG TERM GOAL #2   Title The patient will have grossly 4 to 4+/5 core strength needed for lifting patients at work   Time 8   Period Weeks   Status New     PT LONG TERM GOAL #3   Title Patient will report an overall improvement of pain with lifting 40# or more    Time 8   Period Weeks   Status New     PT LONG TERM GOAL #4   Title FOTO functional outcome score improved from 38% limitation to 23% indicating improved function with less pain   Time 8   Period Weeks   Status New                Plan - 07/13/16 2109    Clinical Impression Statement The patient reports low back pain which started last October 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  He reports right > left LBP without LE symptoms.  He states his symptoms have improved but he continues to have pain with even light lifting < 20 pounds.  He avoids moving/lifting patients as much as possible  at work.  Lumbar ROM limited in flexion.  Repeated movement testing is inconclusive for directional preference.  Decreased HS length bilaterally.  Decreased lower abdominal and lumbar multifidi activation.  No neural signs.  Decreased lumbar lordosis.  Right lumbar paraspinal increased muscle tone.  He would benefit from additional education on lifting principles and instruction on core strengthening to assist with lifting and return to higher level recreational activities including running and rock climbing.     History and Personal Factors relevant to plan of care: pelvic floor dysfunction which responded well to PT;  TMJ; young age; minimal co-morbidities   Clinical Presentation Stable   Clinical Presentation due to: low back pain   Clinical Decision Making Low   Rehab Potential Good   Clinical Impairments Affecting Rehab Potential none   PT Frequency 2x / week   PT Duration 8 weeks   PT Treatment/Interventions ADLs/Self Care Home Management;Electrical Stimulation;Cryotherapy;Moist Heat;Traction;Ultrasound;Patient/family education;Neuromuscular re-education;Therapeutic exercise;Therapeutic activities;Manual techniques;Taping;Dry needling   PT Next Visit Plan review press ups;  add bird dogs, abdominal brace progression;  multifidi muscle activation without gluteals;  body mechanics training;  DN to right lumbar paraspinal if needed   Recommended Other Services home TENS unit for pain control   Consulted and Agree with Plan of Care Patient      Patient will benefit from skilled therapeutic intervention in order to improve the following deficits and impairments:  Pain, Decreased strength, Decreased range of motion, Improper body mechanics, Postural dysfunction  Visit Diagnosis: Chronic bilateral low back pain without sciatica - Plan: PT plan of care cert/re-cert  Muscle weakness (generalized) - Plan: PT plan of care cert/re-cert     Problem List Patient Active Problem List   Diagnosis  Date Noted  . Low back pain 06/30/2016  . Pelvic pain in male 10/14/2015   Lavinia Sharps, PT 07/13/16 9:28 PM Phone: [phone] Fax: [phone]  Vivien Presto 07/13/2016, 9:28 PM  [facility] [address], [address] [place], [place], [address] Phone: [phone]   Fax:  [phone]  Name: Dominic Gibson MRN: [id number] Date of Birth: 11-Jun-1985

## 2016-07-13 NOTE — Patient Instructions (Signed)
http://[email or web address].exer.us/243       Lie face down, hands close to chest. Press trunk up, arching back. Keep neck long, shoulders down. Tighten buttocks to protect lower back. Hold _3___ seconds. Repeat ___10_ times. Do ___5-6_ sessions per day.  Copyright  VHI. All rights reserved.   Copyright  VHI. All rights reserved.   Copyright  VHI. All rights reserved.  Backward Bend (Standing)   Arch backward to make hollow of back deeper. Hold __3__ seconds. Repeat __10__ times per set. Do __1__ sets per session. Do __5-6 sessions per day.  http://orth.exer.us/178   Copyright  VHI. All rights reserved.    TENS UNIT  This is helpful for muscle pain and spasm.   Search and Purchase a TENS 7000 2nd edition at www.tenspros.com or www.amazon.com  (It should be less than $30)     TENS unit instructions:   Do not shower or bathe with the unit on  Turn the unit off before removing electrodes or batteries  If the electrodes lose stickiness add a drop of water to the electrodes after they are disconnected from the unit and place on plastic sheet. If you continued to have difficulty, call the TENS unit company to purchase more electrodes.  Do not apply lotion on the skin area prior to use. Make sure the skin is clean and dry as this will help prolong the life of the electrodes.  After use, always check skin for unusual red areas, rash or other skin difficulties. If there are any skin problems, does not apply electrodes to the same area.  Never remove the electrodes from the unit by pulling the wires.  Do not use the TENS unit or electrodes other than as directed.  Do not change electrode placement without consulting your therapist or physician.  Keep 2 fingers with between each electrode.     Lifting Principles  .Maintain proper posture and head alignment. .Slide object as close as possible before lifting. .Move obstacles out of the way. .Test before lifting; ask for  help if too heavy. .Tighten stomach muscles without holding breath. .Use smooth movements; do not jerk. .Use legs to do the work, and pivot with feet. .Distribute the work load symmetrically and close to the center of trunk. .Push instead of pull whenever possible.   Squat down and hold basket close to stand. Use leg muscles to do the work.    Avoid twisting or bending back. Pivot around using foot movements, and bend at knees if needed when reaching for articles.        Getting Into / Out of Bed   Lower self to lie down on one side by raising legs and lowering head at the same time. Use arms to assist moving without twisting. Bend both knees to roll onto back if desired. To sit up, start from lying on side, and use same move-ments in reverse. Keep trunk aligned with legs.    Shift weight from front foot to back foot as item is lifted off shelf.    When leaning forward to pick object up from floor, extend one leg out behind. Keep back straight. Hold onto a sturdy support with other hand.      Sit upright, head facing forward. Try using a roll to support lower back. Keep shoulders relaxed, and avoid rounded back. Keep hips level with knees. Avoid crossing legs for long periods.      Lavinia Sharps PT [facility] [address], [address]  [place], [place] [address] Phone # [phone] Fax [phone]

## 2016-07-14 ENCOUNTER — Ambulatory Visit: Payer: 59 | Admitting: Physical Therapy

## 2016-07-14 ENCOUNTER — Encounter: Payer: Self-pay | Admitting: Physical Therapy

## 2016-07-14 DIAGNOSIS — M545 Low back pain: Secondary | ICD-10-CM

## 2016-07-14 DIAGNOSIS — M6281 Muscle weakness (generalized): Secondary | ICD-10-CM

## 2016-07-14 DIAGNOSIS — G8929 Other chronic pain: Secondary | ICD-10-CM

## 2016-07-14 NOTE — Patient Instructions (Signed)
Abdominal Bracing With Pelvic Floor (Hook-Lying)    With neutral spine, tighten pelvic floor and abdominals hold for 5 sec. Repeat _10__ times. Do _1__ times a day.   Copyright  VHI. All rights reserved.  Bracing With Leg March (Hook-Lying)    With neutral spine, tighten pelvic floor and abdominals and hold. Alternating legs, lift foot 6___ inches and return to floor. Repeat _20__ times. Do _1__ times a day.   Copyright  VHI. All rights reserved.  Bracing With Arm / Leg Raise (Quadruped)    On hands and knees find neutral spine. Tighten pelvic floor and abdominals and hold. Alternating, lift arm to shoulder level and opposite leg to hip level. Repeat _10__ times. Do _1__ times a day.   Copyright  VHI. All rights reserved.  Trigger Point Dry Needling  . What is Trigger Point Dry Needling (DN)? o DN is a physical therapy technique used to treat muscle pain and dysfunction. Specifically, DN helps deactivate muscle trigger points (muscle knots).  o A thin filiform needle is used to penetrate the skin and stimulate the underlying trigger point. The goal is for a local twitch response (LTR) to occur and for the trigger point to relax. No medication of any kind is injected during the procedure.   . What Does Trigger Point Dry Needling Feel Like?  o The procedure feels different for each individual patient. Some patients report that they do not actually feel the needle enter the skin and overall the process is not painful. Very mild bleeding may occur. However, many patients feel a deep cramping in the muscle in which the needle was inserted. This is the local twitch response.   Marland Kitchen How Will I feel after the treatment? o Soreness is normal, and the onset of soreness may not occur for a few hours. Typically this soreness does not last longer than two days.  o Bruising is uncommon, however; ice can be used to decrease any possible bruising.  o In rare cases feeling tired or nauseous after  the treatment is normal. In addition, your symptoms may get worse before they get better, this period will typically not last longer than 24 hours.   . What Can I do After My Treatment? o Increase your hydration by drinking more water for the next 24 hours. o You may place ice or heat on the areas treated that have become sore, however, do not use heat on inflamed or bruised areas. Heat often brings more relief post needling. o You can continue your regular activities, but vigorous activity is not recommended initially after the treatment for 24 hours. o DN is best combined with other physical therapy such as strengthening, stretching, and other therapies.    [facility] [address], [address] [place], [place] [address] Phone # [phone] Fax [phone]

## 2016-07-14 NOTE — Therapy (Signed)
[facility] [address], [address] [place], [place], [address] Phone: [phone]   Fax:  [phone]  Physical Therapy Treatment  Patient Details  Name: Dominic Gibson MRN: [id number] Date of Birth: [birth year] Referring Provider: Dr. Pearletha Forge  Encounter Date: 07/14/2016      PT End of Session - 07/14/16 1313    Visit Number 2   Date for PT Re-Evaluation 09/07/16   Authorization Type UMR   PT Start Time 1230   PT Stop Time 1310   PT Time Calculation (min) 40 min   Activity Tolerance Patient tolerated treatment well   Behavior During Therapy [facility] for tasks assessed/performed      Past Medical History:  Diagnosis Date  . Allergy     Past Surgical History:  Procedure Laterality Date  . COSMETIC SURGERY      There were no vitals filed for this visit.      Subjective Assessment - 07/14/16 1241    Subjective I have no pain today.  I have trouble with not leaning over at work.    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 not limited   How long can you walk comfortably? unlimited   Diagnostic tests X-ray showed "thinning of discs"    Patient Stated Goals I don't want to hurt myself;  strengthen some muscles up   Currently in Pain? No/denies   Multiple Pain Sites No                         [facility] Adult PT Treatment/Exercise - 07/14/16 0001      Exercises   Exercises Lumbar     Lumbar Exercises: Supine   Ab Set 10 reps  hold for 10 seconds     Lumbar Exercises: Quadruped   Madcat/Old Horse 10 reps   Opposite Arm/Leg Raise Right arm/Left leg;Left arm/Right leg;15 reps;3 seconds     Manual Therapy   Manual Therapy Taping;Soft tissue mobilization   Soft tissue mobilization lumbar paraspinals   McConnell along lumbar paraspinals to keep  lumbar extension          Trigger Point Dry Needling - 07/14/16 1314    Consent Given? Yes   Education Handout Provided Yes   Muscles Treated Lower Body --  lumbar multifidi bil. 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   Time 4   Period Weeks   Status New     PT SHORT TERM GOAL #3   Title The patient will report ability  to do moderate lifting at work 30# with minimal pain   Time 4   Period Weeks   Status New           PT Long Term Goals - 07/13/16 2121      PT LONG TERM GOAL #1   Title The patient will be independent in safe self progression of HEP needed for further improvements in core strength   09/07/16   Time 8   Period Weeks   Status New     PT LONG TERM GOAL #2   Title The patient will have grossly 4 to 4+/5 core strength needed for lifting patients at work   Time 8   Period Weeks   Status New     PT LONG TERM GOAL #3   Title Patient will report an overall improvement of pain with lifting 40# or more    Time 8   Period Weeks   Status New     PT LONG TERM GOAL #4   Title FOTO functional outcome score improved from 38% limitation to 23% indicating improved function with less pain   Time 8   Period Weeks   Status New               Plan - 07/14/16 1315    Clinical Impression Statement Patient had increased  tissue mobility after the dry needling.  Patient had several twitches with the dry needling Patient was able to engage his abdominals with core exercises.  Patient will benefit from skilled therapy to improve core, work on Counsellor and returen to higher level recreation.    Rehab Potential Good   Clinical Impairments Affecting Rehab Potential none   PT Frequency 2x / week   PT Duration 8 weeks   PT Treatment/Interventions ADLs/Self Care Home Management;Electrical Stimulation;Cryotherapy;Moist Heat;Traction;Ultrasound;Patient/family education;Neuromuscular re-education;Therapeutic exercise;Therapeutic activities;Manual techniques;Taping;Dry needling   PT Next Visit Plan see if dry needling and taping helped; body mechaincs training, advance core stabilization   PT Home Exercise Plan progress as needed   Recommended Other Services initial signe on 07/13/2016   Consulted and Agree with Plan of Care Patient      Patient will benefit from skilled therapeutic intervention in order to improve the following deficits and impairments:  Pain, Decreased strength, Decreased range of motion, Improper body mechanics, Postural dysfunction  Visit Diagnosis: Muscle weakness (generalized)  Chronic bilateral low back pain without sciatica     Problem List Patient Active Problem List   Diagnosis Date Noted  . Low back pain 06/30/2016  . Pelvic pain in male 10/14/2015    Eulis Foster, PT 07/14/16 1:18 PM    [facility] [address], [address] [place], [place], [address] Phone: [phone]   Fax:  [phone]  Name: Dominic Gibson MRN: [id number] Date of Birth: July 21, 1985

## 2016-07-21 ENCOUNTER — Encounter: Payer: Self-pay | Admitting: Physical Therapy

## 2016-07-21 ENCOUNTER — Ambulatory Visit: Payer: 59 | Admitting: Physical Therapy

## 2016-07-21 DIAGNOSIS — M6281 Muscle weakness (generalized): Secondary | ICD-10-CM | POA: Diagnosis not present

## 2016-07-21 DIAGNOSIS — G8929 Other chronic pain: Secondary | ICD-10-CM | POA: Diagnosis not present

## 2016-07-21 DIAGNOSIS — M545 Low back pain: Secondary | ICD-10-CM | POA: Diagnosis not present

## 2016-07-21 NOTE — Therapy (Signed)
[facility] [address] [place], [place], [address] Phone: [phone]   Fax:  [phone]  Physical Therapy Treatment  Patient Details  Name: Dominic Gibson MRN: [id number] Date of Birth: Dec 02, 1985 Referring Provider: Dr. Pearletha Forge  Encounter Date: 07/21/2016      PT End of Session - 07/21/16 0928    Visit Number 3   Date for PT Re-Evaluation 09/07/16   Authorization Type UMR   PT Start Time 0845   PT Stop Time 0925   PT Time Calculation (min) 40 min   Activity Tolerance Patient tolerated treatment well   Behavior During Therapy [facility] for tasks assessed/performed      Past Medical History:  Diagnosis Date  . Allergy     Past Surgical History:  Procedure Laterality Date  . COSMETIC SURGERY      There were no vitals filed for this visit.      Subjective Assessment - 07/21/16 0849    Subjective I have not been having spasms.  I am not lifting yet.  I feel more muscle soreness.    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 not limited   How long can you walk comfortably? unlimited   Diagnostic tests X-ray showed "thinning of discs"    Patient Stated Goals I don't want to hurt myself;  strengthen some muscles up   Currently in Pain? No/denies                         [facility] Adult PT Treatment/Exercise - 07/21/16 0001      Therapeutic Activites    Therapeutic Activities Lifting;ADL's   ADL's tie shoes, sit to stand, reaching into cabinets   Lifting lifting from floor and mat height 10#, golf lift,      Lumbar Exercises: Aerobic   Stationary Bike level 7 for 5 min     Manual Therapy   Manual Therapy Soft tissue mobilization   Soft tissue mobilization lumbar paraspinals          Trigger Point Dry  Needling - 07/21/16 0909    Consent Given? Yes   Muscles Treated Upper Body --  twitch response   Muscles Treated Lower Body --  bil. 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   Time 4   Period Weeks   Status New     PT SHORT TERM GOAL #3   Title The patient will report ability to do moderate lifting at  work 30# with minimal pain   Time 4   Period Weeks   Status New           PT Long Term Goals - 07/13/16 2121      PT LONG TERM GOAL #1   Title The patient will be independent in safe self progression of HEP needed for further improvements in core strength   09/07/16   Time 8   Period Weeks   Status New     PT LONG TERM GOAL #2   Title The patient will have grossly 4 to 4+/5 core strength needed for lifting patients at work   Time 8   Period Weeks   Status New     PT LONG TERM GOAL #3   Title Patient will report an overall improvement of pain with lifting 40# or more    Time 8   Period Weeks   Status New     PT LONG TERM GOAL #4   Title FOTO functional outcome score improved from 38% limitation to 23% indicating improved function with less pain   Time 8   Period Weeks   Status New               Plan - 07/21/16 3888    Clinical Impression Statement Patient is not having pain instead muscle soreness.  Patient has  stopped using his back brace at work.  Patient understands correct body mechanics with lifting, how to tie his shoes, golf lift, moving patients in the bed with spinal nuetral and using his legs.  Patient had several twitches with the dry needling.  Patient is going to start jogging.  Patient will start lumbar flexion in therapy next week. Patient will benefit from skilled therapy to improve core and return to higher level recreation.    Rehab Potential Good   Clinical Impairments Affecting Rehab Potential none   PT Frequency 2x / week   PT Duration 8 weeks   PT Treatment/Interventions ADLs/Self Care Home Management;Electrical Stimulation;Cryotherapy;Moist Heat;Traction;Ultrasound;Patient/family education;Neuromuscular re-education;Therapeutic exercise;Therapeutic activities;Manual techniques;Taping;Dry needling   PT Next Visit Plan  advance core stabilization, start lumbar flexion, dry needling if needed; Patient will been seen one time per week now   PT Home Exercise Plan progress as needed   Consulted and Agree with Plan of Care Patient      Patient will benefit from skilled therapeutic intervention in order to improve the following deficits and impairments:  Pain, Decreased strength, Decreased range of motion, Improper body mechanics, Postural dysfunction  Visit Diagnosis: Muscle weakness (generalized)  Chronic bilateral low back pain without sciatica     Problem List Patient Active Problem List   Diagnosis Date Noted  . Low back pain 06/30/2016  . Pelvic pain in male 10/14/2015    Eulis Foster, PT 07/21/16 9:33 AM   [facility] [address] [place], [place], [address] Phone: [phone]   Fax:  [phone]  Name: Dominic Gibson MRN: [id number] Date of Birth: 08/08/1985

## 2016-07-22 ENCOUNTER — Encounter: Payer: 59 | Admitting: Physical Therapy

## 2016-07-26 ENCOUNTER — Encounter: Payer: 59 | Admitting: Physical Therapy

## 2016-07-26 DIAGNOSIS — M545 Low back pain: Secondary | ICD-10-CM | POA: Diagnosis not present

## 2016-07-28 ENCOUNTER — Ambulatory Visit: Payer: 59

## 2016-07-28 DIAGNOSIS — G8929 Other chronic pain: Secondary | ICD-10-CM

## 2016-07-28 DIAGNOSIS — M545 Low back pain: Secondary | ICD-10-CM | POA: Diagnosis not present

## 2016-07-28 DIAGNOSIS — M6281 Muscle weakness (generalized): Secondary | ICD-10-CM | POA: Diagnosis not present

## 2016-07-28 MED FILL — DEXTROAMP-AMPHETAMIN 20 MG: 20 | 30 days supply | Qty: 45 | Fill #0

## 2016-07-28 NOTE — Therapy (Signed)
[facility] [address], [address] [place], [place], [address] Phone: [phone]   Fax:  [phone]  Physical Therapy Treatment  Patient Details  Name: Dominic Gibson MRN: [id number] Date of Birth: 1985/10/21 Referring Provider: Dr. Pearletha Forge  Encounter Date: 07/28/2016      PT End of Session - 07/28/16 1240    Visit Number 4   Date for PT Re-Evaluation 09/07/16   Authorization Type UMR   PT Start Time 1234   PT Stop Time 1315   PT Time Calculation (min) 41 min   Activity Tolerance Patient tolerated treatment well   Behavior During Therapy [facility] for tasks assessed/performed      Past Medical History:  Diagnosis Date  . Allergy     Past Surgical History:  Procedure Laterality Date  . COSMETIC SURGERY      There were no vitals filed for this visit.      Subjective Assessment - 07/28/16 1237    Subjective Pt. doing well today.     Patient Stated Goals I don't want to hurt myself;  strengthen some muscles up   Currently in Pain? No/denies   Pain Score 0-No pain   Multiple Pain Sites No                         [facility] Adult PT Treatment/Exercise - 07/28/16 1246      Exercises   Exercises Shoulder     Lumbar Exercises: Aerobic   Stationary Bike level 5 for 6 min     Lumbar Exercises: Machines for Strengthening   Other Lumbar Machine Exercise B staggered stance squat, single arm row 35# x 15 reps    Other Lumbar Machine Exercise Straight arm pull down 25# 2 x 15 reps   2nd set with 45# traditional pull down; cues for scap. dep     Lumbar Exercises: Standing   Functional Squats 15 reps;3 seconds   Functional Squats Limitations with blue TB around knees; avoiding full extension   Row 15 reps;Both;Strengthening;Theraband   Theraband Level (Row) Level 4 (Blue)   Row Limitations in deep staggered stance position   Shoulder Extension 15 reps;Theraband;Strengthening;Both   Theraband Level  (Shoulder Extension) Level 4 (Blue)   Shoulder Extension Limitations in staggered stance with blue TB in door   Other Standing Lumbar Exercises Standing B pallof press with single blue band in door 2 x 20 reps  seated on blue p-ball for 2nd set    Other Standing Lumbar Exercises Long lunge back extension and Y (B shoulder flexion with red band at bottom of door 2 x 10 reps; alternating feet positioins      Lumbar Exercises: Supine   Other Supine Lumbar Exercises prone 3-way childs x 30 sec      Lumbar Exercises: Quadruped   Opposite Arm/Leg Raise Right arm/Left leg;Left arm/Right leg;15 reps;3 seconds   Opposite Arm/Leg Raise Limitations 2# at LE's, 1# at UE                 PT Education - 07/28/16 1319    Education provided Yes   Education Details Pallof press with blue band issue to pt., row with blue band, row/ext. with band in doorway.   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   Time 4   Period Weeks   Status On-going     PT SHORT TERM GOAL #3   Title The patient will report ability to do moderate lifting at work 30# with minimal pain   Time 4   Period Weeks   Status On-going           PT Long Term Goals - 07/28/16 1320      PT LONG TERM GOAL #1   Title The patient will be independent in safe self progression of HEP needed for further improvements in core strength   09/07/16   Time 8   Period Weeks   Status On-going     PT  LONG TERM GOAL #2   Title The patient will have grossly 4 to 4+/5 core strength needed for lifting patients at work   Time 8   Period Weeks   Status On-going     PT LONG TERM GOAL #3   Title Patient will report an overall improvement of pain with lifting 40# or more    Time 8   Period Weeks   Status On-going     PT LONG TERM GOAL #4   Title FOTO functional outcome score improved from 38% limitation to 23% indicating improved function with less pain   Time 8   Period Weeks   Status On-going               Plan - 07/28/16 1320    Clinical Impression Statement Pt. doing well today noting only having LBP while working.  Tolerated progression of lumbopelvic and scapular strengthening activities well today without issue.  HEP updated to include pallof press and scapular strengthening activities.       PT Treatment/Interventions ADLs/Self Care Home Management;Electrical Stimulation;Cryotherapy;Moist Heat;Traction;Ultrasound;Patient/family education;Neuromuscular re-education;Therapeutic exercise;Therapeutic activities;Manual techniques;Taping;Dry needling   PT Next Visit Plan  advance core stabilization, start lumbar flexion, dry needling if needed; Patient will been seen one time per week now      Patient will benefit from skilled therapeutic intervention in order to improve the following deficits and impairments:  Pain, Decreased strength, Decreased range of motion, Improper body mechanics, Postural dysfunction  Visit Diagnosis: Muscle weakness (generalized)  Chronic bilateral low back pain without sciatica     Problem List Patient Active Problem List   Diagnosis Date Noted  . Low back pain 06/30/2016  . Pelvic pain in male 10/14/2015    Kermit Balo, PTA 07/28/16 1:27 PM  [facility] [address], [address] [place], [place], [address] Phone: [phone]   Fax:  [phone]  Name: Dominic Gibson MRN:  [id number] Date of Birth: 1985-06-08

## 2016-08-04 ENCOUNTER — Encounter: Payer: Self-pay | Admitting: Physical Therapy

## 2016-08-04 ENCOUNTER — Ambulatory Visit (INDEPENDENT_AMBULATORY_CARE_PROVIDER_SITE_OTHER): Payer: 59 | Admitting: Family Medicine

## 2016-08-04 ENCOUNTER — Ambulatory Visit: Payer: 59 | Attending: Family Medicine | Admitting: Physical Therapy

## 2016-08-04 ENCOUNTER — Encounter: Payer: Self-pay | Admitting: Family Medicine

## 2016-08-04 DIAGNOSIS — M6281 Muscle weakness (generalized): Secondary | ICD-10-CM | POA: Insufficient documentation

## 2016-08-04 DIAGNOSIS — M545 Low back pain, unspecified: Secondary | ICD-10-CM

## 2016-08-04 DIAGNOSIS — G8929 Other chronic pain: Secondary | ICD-10-CM | POA: Diagnosis not present

## 2016-08-04 NOTE — Patient Instructions (Signed)
Do the home exercises and stretches most days of the week for 6 weeks then ok to stop unless pain starts to feel like it's creeping back. I don't think you'll have any problems moving forward but call me if you do otherwise follow up as needed.

## 2016-08-04 NOTE — Therapy (Signed)
[facility] [address], [place], [place], [address] Phone: [phone]   Fax:  [phone]  Physical Therapy Treatment  Patient Details  Name: Dominic Gibson MRN: [id number] Date of Birth: Nov 12, 1985 Referring Provider: Dr. Karlton Lemon  Encounter Date: 08/04/2016      PT End of Session - 08/04/16 1218    Visit Number 5   Date for PT Re-Evaluation 09/07/16   Authorization Type UMR   PT Start Time 1145   PT Stop Time 1223   PT Time Calculation (min) 38 min   Activity Tolerance Patient tolerated treatment well   Behavior During Therapy [facility] for tasks assessed/performed      Past Medical History:  Diagnosis Date  . Allergy     Past Surgical History:  Procedure Laterality Date  . COSMETIC SURGERY      There were no vitals filed for this visit.      Subjective Assessment - 08/04/16 1150    Subjective My back is doing very well.  I have been activating my core.    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 not limited   How long can you walk comfortably? unlimited   Diagnostic tests X-ray showed "thinning of discs"    Patient Stated Goals I don't want to hurt myself;  strengthen some muscles up   Currently in Pain? No/denies   Multiple Pain Sites No            [facility] PT Assessment - 08/04/16 0001      Assessment   Medical Diagnosis lumbar strain   Referring Provider Dr. Karlton Lemon   Onset Date/Surgical Date --  Oct 2017   Hand Dominance Right   Next MD Visit early Aug    Prior Therapy for pelvic floor     Precautions   Precautions None     Restrictions   Weight Bearing Restrictions No     Balance Screen   Has the patient fallen in the past 6 months No   Has the patient had a decrease in activity level  because of a fear of falling?  No   Is the patient reluctant to leave their home because of a fear of falling?  No     Home Ecologist residence     Prior Function   Level of Independence Independent   Vocation Full time employment   Vocation Requirements ED nurse   Leisure go for walks with dog;  hang out with friends, work a lot of overtime     Cognition   Overall Cognitive Status Within Functional Limits for tasks assessed     Observation/Other Assessments   Focus on Therapeutic Outcomes (FOTO)  18% limitation      Posture/Postural Control   Posture/Postural Control No significant limitations     AROM   Lumbar Flexion 55   Lumbar Extension 35   Lumbar - Right Side Bend 40   Lumbar - Left Side Bend 40     Strength   Right Hip Extension 5/5   Right Hip ABduction 5/5   Left Hip Extension 5/5   Lumbar Flexion 5/5   Lumbar Extension 5/5     Palpation   Palpation comment good tone in lumbar paraspinals  [facility] Adult PT Treatment/Exercise - 08/04/16 0001      Lumbar Exercises: Aerobic   Stationary Bike level 5 for 6 min     Lumbar Exercises: Standing   Row 15 reps;Both;Strengthening;Theraband   Theraband Level (Row) Level 4 (Blue)   Row Limitations in a deep staggered stance position   Shoulder Extension 15 reps;Theraband;Strengthening;Both   Theraband Level (Shoulder Extension) Level 4 (Blue)   Shoulder Extension Limitations in a deep staggered position     Lumbar Exercises: Supine   Other Supine Lumbar Exercises opposite arm and leg flexion with 2# on hands and ankles                PT Education - 08/04/16 1218    Education provided Yes   Education Details core and abdominal exercises   Person(s) Educated Patient   Methods Explanation;Demonstration;Verbal cues;Handout   Comprehension Returned demonstration;Verbalized understanding          PT Short Term Goals - 08/04/16 1151      PT  SHORT TERM GOAL #2   Title Lumbar flexion ROM to 50 degrees needed for greater mobility for work duties as an ED work   Time 4   Period Weeks   Status Achieved     PT SHORT TERM GOAL #3   Title The patient will report ability to do moderate lifting at work 30# with minimal pain   Time 4   Period Weeks   Status Achieved           PT Long Term Goals - 08/04/16 1151      PT LONG TERM GOAL #1   Title The patient will be independent in safe self progression of HEP needed for further improvements in core strength   09/07/16   Time 8   Period Weeks   Status Achieved     PT LONG TERM GOAL #2   Title The patient will have grossly 4 to 4+/5 core strength needed for lifting patients at work   Time 8   Period Weeks   Status Achieved     PT LONG TERM GOAL #3   Title Patient will report an overall improvement of pain with lifting 40# or more    Time 8   Period Weeks   Status Achieved     PT LONG TERM GOAL #4   Title FOTO functional outcome score improved from 38% limitation to 23% indicating improved function with less pain   Time 8   Period Weeks   Status Achieved               Plan - 08/04/16 1219    Clinical Impression Statement Patient has met all of his goals.  Patient has full lumbar ROM and strength.  Patient is able to lift at work without difficulties . Patient is able to do a light jog without pain.  Patient is ready for discharge.    Rehab Potential Good   Clinical Impairments Affecting Rehab Potential none   PT Treatment/Interventions ADLs/Self Care Home Management;Electrical Stimulation;Cryotherapy;Moist Heat;Traction;Ultrasound;Patient/family education;Neuromuscular re-education;Therapeutic exercise;Therapeutic activities;Manual techniques;Taping;Dry needling   PT Next Visit Plan Discharge to HEP   PT Home Exercise Plan Current HEP   Consulted and Agree with Plan of Care Patient      Patient will benefit from skilled therapeutic intervention in order to  improve the following deficits and impairments:  Pain, Decreased strength, Decreased range of motion, Improper body mechanics, Postural dysfunction  Visit Diagnosis: Muscle weakness (generalized)  Chronic bilateral low  back pain without sciatica     Problem List Patient Active Problem List   Diagnosis Date Noted  . Low back pain 06/30/2016  . Pelvic pain in male 10/14/2015    Earlie Counts, PT 08/04/16 12:24 PM   [facility] [address], [place], [place], [address] Phone: [phone]   Fax:  [phone]  Name: Dominic Gibson MRN: [id number] Date of Birth: September 09, 1985 PHYSICAL THERAPY DISCHARGE SUMMARY  Visits from Start of Care: 5  Current functional level related to goals / functional outcomes: See above.    Remaining deficits: See above   Education / Equipment: HEP Plan: Patient agrees to discharge.  Patient goals were met. Patient is being discharged due to meeting the stated rehab goals.  Thank you for the referral. Earlie Counts, PT 08/04/16 12:24 PM  ?????

## 2016-08-04 NOTE — Patient Instructions (Addendum)
Bracing With Arms / Legs (Hook-Lying)    With neutral spine, tighten pelvic floor and abdominals and hold. Raise arm and opposite leg, then return. Repeat wtih other limbs. Repeat _20__ times. Do _1__ times a day.  2# on hand and ankles Copyright  VHI. All rights reserved.    Bracing With March in Bridging (Hook-Lying)    With neutral spine, tighten pelvic floor and abdominals and hold. Lift bottom and hold, then march in place. March __20_ times. Do _1__ times a day.   Copyright  VHI. All rights reserved.  Bracing With Curl-Up (Hook-Lying)    With neutral spine, tighten pelvic floor and abdominals and hold. Lift head and shoulders. Repeat _10__ times. Do _1__ times a day. Alternate arm positions: Fold arms across chest. Support neck with clasped hands.    Copyright  VHI. All rights reserved.  Bracing With Diagonal Curl-Up (Hook-Lying)    With neutral spine, tighten pelvic floor and abdominals and hold. Lift head and shoulder and rotate to one side. Repeat _10__ times, alternating sides. Do _1__ times a day. Alternate arm positions: Fold arms across chest. Support neck with clasped hands.   Copyright  VHI. All rights reserved.  [facility] [address] [place], [place] [address] Phone # [phone] Fax [phone]

## 2016-08-05 NOTE — Assessment & Plan Note (Signed)
2/2 lumbar sprain and strain.  Significant improvement with physical therapy and home exercises.  Encouraged to do home exercises for 6 more weeks then hold these.  Tylenol, aleve only if needed.  Heat/ice if needed.  F/u prn.

## 2016-08-05 NOTE — Progress Notes (Signed)
PCP: Maurice Small, MD  Subjective:   HPI: Patient is a 31 y.o. male here for low back pain.  6/26: Patient reports he's had about 9 months of low back pain. Reports back then he caught a falling patient and felt a loud pop in middle of low back. Pain has been constant since then, worse by end of day. Has tried chiropractic care, advil, occasional flexeril, back brace. Pain is 0/10 at rest, up to 5/10 and sharp at worst. He does regular yoga for pelvic floor also. Had x-rays by chiropractor - told only slight curvature but no other abnormalities. No radiation into legs. No numbness/tingling. No bowel/bladder dysfunction.  8/1: Patient reports he's doing very well. Pain level 0/10 now and only gets mild soreness sometimes at work. Has modified work some allowing patients to get themselves up more with him aiding them instead of helping pull them up and this has worked well. Doing physical therapy and home exercises. Also bought new insoles and shoes which have helped. Stopped wearing back brace. Not requiring any medicine. No skin changes. No numbness, bowel/bladder dysfunction.  Past Medical History:  Diagnosis Date  . Allergy     Current Outpatient Prescriptions on File Prior to Visit  Medication Sig Dispense Refill  . alfuzosin (UROXATRAL) 10 MG 24 hr tablet     . amitriptyline (ELAVIL) 10 MG tablet     . amphetamine-dextroamphetamine (ADDERALL) 20 MG tablet     . cyclobenzaprine (FLEXERIL) 10 MG tablet     . EPINEPHrine (EPIPEN 2-PAK) 0.3 mg/0.3 mL IJ SOAJ injection     . hydrOXYzine (ATARAX/VISTARIL) 25 MG tablet     . zolpidem (AMBIEN) 10 MG tablet      No current facility-administered medications on file prior to visit.     Past Surgical History:  Procedure Laterality Date  . COSMETIC SURGERY      Allergies  Allergen Reactions  . Shellfish-Derived Products Other (See Comments)  . Sulfa Antibiotics Rash    Social History   Social History  . Marital  status: Single    Spouse name: N/A  . Number of children: N/A  . Years of education: N/A   Occupational History  . Not on file.   Social History Main Topics  . Smoking status: Former Games developer  . Smokeless tobacco: Never Used  . Alcohol use Yes  . Drug use: No  . Sexual activity: Not on file   Other Topics Concern  . Not on file   Social History Narrative  . No narrative on file    Family History  Problem Relation Age of Onset  . Dementia Maternal Grandmother   . Parkinsonism Maternal Grandfather     BP 117/79   Pulse 61   Ht 6\' 3"  (1.905 m)   Wt 183 lb (83 kg)   BMI 22.87 kg/m   Review of Systems: See HPI above.     Objective:  Physical Exam:  Gen: NAD, comfortable in exam room  Back: No gross deformity, scoliosis. No paraspinal TTP .  No midline or bony TTP. FROM without pain. Strength LEs 5/5 all muscle groups. 2+ MSRs in patellar and achilles tendons, equal bilaterally. Negative SLRs. Mild hamstring tightness. Sensation intact to light touch bilaterally. Negative logroll bilateral hips   Assessment & Plan:  1. Low back pain - 2/2 lumbar sprain and strain.  Significant improvement with physical therapy and home exercises.  Encouraged to do home exercises for 6 more weeks then hold these.  Tylenol,  aleve only if needed.  Heat/ice if needed.  F/u prn.

## 2016-08-06 MED FILL — AMITRIPTYLINE HCL 10 MG TAB: 10 | 30 days supply | Qty: 30 | Fill #2

## 2016-08-31 MED FILL — hydrOXYzine HCL 25 MG TABS: 25 | 90 days supply | Qty: 90 | Fill #3

## 2016-09-01 MED FILL — DEXTROAMP-AMPHETAMIN 20 MG: 20 | 30 days supply | Qty: 45 | Fill #0

## 2016-09-09 MED FILL — AMITRIPTYLINE HCL 10 MG TAB: 10 | 30 days supply | Qty: 30 | Fill #3

## 2016-09-14 ENCOUNTER — Encounter: Payer: Self-pay | Admitting: Family Medicine

## 2016-09-14 ENCOUNTER — Ambulatory Visit (INDEPENDENT_AMBULATORY_CARE_PROVIDER_SITE_OTHER): Payer: 59 | Admitting: Family Medicine

## 2016-09-14 VITALS — BP 104/77 | HR 88 | Ht 75.0 in | Wt 181.0 lb

## 2016-09-14 DIAGNOSIS — M545 Low back pain: Secondary | ICD-10-CM | POA: Diagnosis not present

## 2016-09-14 DIAGNOSIS — G8929 Other chronic pain: Secondary | ICD-10-CM

## 2016-09-14 NOTE — Patient Instructions (Signed)
I would go ahead with an MRI of your lumbar spine at this time (at [facility]) to assess for disc herniation, bulging, other abnormalities. Restart physical therapy for this - do home exercises in meantime. Any medications you take would just be for pain as needed: Tylenol  1-2 tabs three times a day for pain. Aleve 1-2 tabs twice a day with food Capsaicin, aspercreme, or biofreeze topically up to four times a day may also help with pain. Continue with supportive shoes with insoles. Heat or ice 15 minutes at a time 3-4 times a day as needed to help with pain. Let me know if you need a note to [facility].

## 2016-09-15 NOTE — Assessment & Plan Note (Signed)
Consistent with lumbar sprain and strain but has had recurrence recently and feels like on verge of strain/spasms of back.  He will restart physical therapy and do home exercises still.  Tylenol, aleve, topical medicines reviewed.  Heat/ice.  Continue wearing supportive shoes.  Will go ahead with MRI to assess for discogenic issues.

## 2016-09-15 NOTE — Progress Notes (Addendum)
PCP: Maurice Small, MD  Subjective:   HPI: Patient is a 31 y.o. male here for low back pain.  6/26: Patient reports he's had about 9 months of low back pain. Reports back then he caught a falling patient and felt a loud pop in middle of low back. Pain has been constant since then, worse by end of day. Has tried chiropractic care, advil, occasional flexeril, back brace. Pain is 0/10 at rest, up to 5/10 and sharp at worst. He does regular yoga for pelvic floor also. Had x-rays by chiropractor - told only slight curvature but no other abnormalities. No radiation into legs. No numbness/tingling. No bowel/bladder dysfunction.  8/1: Patient reports he's doing very well. Pain level 0/10 now and only gets mild soreness sometimes at work. Has modified work some allowing patients to get themselves up more with him aiding them instead of helping pull them up and this has worked well. Doing physical therapy and home exercises. Also bought new insoles and shoes which have helped. Stopped wearing back brace. Not requiring any medicine. No skin changes. No numbness, bowel/bladder dysfunction.  9/11: Patient reports over past 2 weeks he's started to get sensation that pain is recurring. Feels like he's on the verge of getting spasms in low lumbar spine. Has been doing home exercises - focusing on yoga and stretching more than strengthening recently. Wearing good shoes with inserts. Has been constantly having to get things from low part of Pyxis but trying to bend at knees and squat with good posture. Used flexeril a couple times, takes some ibuprofen which helps. Is taking fish oil, tumeric. Tried TENS unit and lidocaine patches but not noticed change with this. Pain currently 0/10. No radiation of pain. Feels a localized numbness in low back though. No bowel/bladder dysfunction. No genital numbness.  Past Medical History:  Diagnosis Date  . Allergy     Current Outpatient  Prescriptions on File Prior to Visit  Medication Sig Dispense Refill  . alfuzosin (UROXATRAL) 10 MG 24 hr tablet     . amitriptyline (ELAVIL) 10 MG tablet     . amphetamine-dextroamphetamine (ADDERALL) 20 MG tablet     . cyclobenzaprine (FLEXERIL) 10 MG tablet     . EPINEPHrine (EPIPEN 2-PAK) 0.3 mg/0.3 mL IJ SOAJ injection     . hydrOXYzine (ATARAX/VISTARIL) 25 MG tablet     . zolpidem (AMBIEN) 10 MG tablet      No current facility-administered medications on file prior to visit.     Past Surgical History:  Procedure Laterality Date  . COSMETIC SURGERY      Allergies  Allergen Reactions  . Shellfish-Derived Products Other (See Comments)  . Sulfa Antibiotics Rash    Social History   Social History  . Marital status: Single    Spouse name: N/A  . Number of children: N/A  . Years of education: N/A   Occupational History  . Not on file.   Social History Main Topics  . Smoking status: Former Games developer  . Smokeless tobacco: Never Used  . Alcohol use Yes  . Drug use: No  . Sexual activity: Not on file   Other Topics Concern  . Not on file   Social History Narrative  . No narrative on file    Family History  Problem Relation Age of Onset  . Dementia Maternal Grandmother   . Parkinsonism Maternal Grandfather     BP 104/77   Pulse 88   Ht  (1.905 m)   Wt  181 lb (82.1 kg)   BMI 22.62 kg/m   Review of Systems: See HPI above.     Objective:  Physical Exam:  Gen: NAD, comfortable in exam room  Back: No gross deformity, scoliosis. Mild TTP low lumbar at L4 and 5 with paraspinal tenderness.  No other tenderness. FROM without pain. Strength LEs 5/5 all muscle groups. 2+ MSRs in patellar and achilles tendons, equal bilaterally. Negative SLRs. Mild hamstring tightness. Sensation intact to light touch bilaterally. Negative logroll bilateral hips   Assessment & Plan:  1. Low back pain - Consistent with lumbar sprain and strain but has had recurrence  recently and feels like on verge of strain/spasms of back.  He will restart physical therapy and do home exercises still.  Tylenol, aleve, topical medicines reviewed.  Heat/ice.  Continue wearing supportive shoes.  Will go ahead with MRI to assess for discogenic issues.  Addendum:  MRI reviewed and discussed with patient.  He does have a right sided disc protrusion at L5-S1 with potential for impingement of S1 nerve root.  We discussed ESIs and physical therapy.  Encouraged him to repeat physical therapy and do home exercises regularly.  Call us if he's not improving as expected and would trial the ESI.  He will let us know how he's doing in 6 weeks.

## 2016-09-20 ENCOUNTER — Ambulatory Visit (INDEPENDENT_AMBULATORY_CARE_PROVIDER_SITE_OTHER): Payer: 59

## 2016-09-20 DIAGNOSIS — M545 Low back pain: Secondary | ICD-10-CM | POA: Diagnosis not present

## 2016-09-20 DIAGNOSIS — M5127 Other intervertebral disc displacement, lumbosacral region: Secondary | ICD-10-CM

## 2016-09-20 DIAGNOSIS — R200 Anesthesia of skin: Secondary | ICD-10-CM | POA: Diagnosis not present

## 2016-09-20 DIAGNOSIS — G8929 Other chronic pain: Secondary | ICD-10-CM

## 2016-09-29 ENCOUNTER — Telehealth: Payer: Self-pay | Admitting: Family Medicine

## 2016-09-29 NOTE — Telephone Encounter (Signed)
Patient would like to change physical therapy locations to the office on [address]. Requesting a referral

## 2016-09-30 NOTE — Addendum Note (Signed)
Addended by: Kathi Simpers F on: 09/30/2016 04:46 PM   Modules accepted: Orders

## 2016-09-30 NOTE — Telephone Encounter (Signed)
This works for me - thanks!

## 2016-09-30 NOTE — Telephone Encounter (Signed)
Referral sent to PT on [address].

## 2016-10-06 MED FILL — AMITRIPTYLINE HCL 10 MG TAB: 10 | 30 days supply | Qty: 30 | Fill #4

## 2016-10-06 MED FILL — AMPHETAMINE SALTS 20 MG TAB: 20 | 30 days supply | Qty: 45 | Fill #0

## 2016-10-11 ENCOUNTER — Ambulatory Visit: Payer: 59 | Attending: Family Medicine

## 2016-10-11 DIAGNOSIS — R293 Abnormal posture: Secondary | ICD-10-CM | POA: Insufficient documentation

## 2016-10-11 DIAGNOSIS — M545 Low back pain: Secondary | ICD-10-CM | POA: Insufficient documentation

## 2016-10-11 DIAGNOSIS — M6281 Muscle weakness (generalized): Secondary | ICD-10-CM | POA: Diagnosis not present

## 2016-10-11 DIAGNOSIS — G8929 Other chronic pain: Secondary | ICD-10-CM | POA: Diagnosis not present

## 2016-10-11 DIAGNOSIS — M62830 Muscle spasm of back: Secondary | ICD-10-CM | POA: Diagnosis not present

## 2016-10-11 NOTE — Therapy (Signed)
[facility] [address] [place], [place], [address] Phone: [phone]   Fax:  [phone]  Physical Therapy Evaluation  Patient Details  Name: Dominic Gibson MRN: [id number] Date of Birth: 1985-02-01 Referring Provider: Dr Norton Blizzard  Encounter Date: 10/11/2016      PT End of Session - 10/11/16 1058    Visit Number 1   Number of Visits 6   Date for PT Re-Evaluation 11/19/16   Authorization Type UMR   PT Start Time 1017   PT Stop Time 1100   PT Time Calculation (min) 43 min   Activity Tolerance Patient tolerated treatment well;No increased pain   Behavior During Therapy WFL for tasks assessed/performed      Past Medical History:  Diagnosis Date  . Allergy     Past Surgical History:  Procedure Laterality Date  . COSMETIC SURGERY      There were no vitals filed for this visit.       Subjective Assessment - 10/11/16 1019    Subjective Pain went away and did HEP and then LBP went numb a work. Now with bulge.   Occasional spasms.  ER nurse for work.  Feels less physical activity due to working may contribute.   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 not limited   Diagnostic tests MRI disc bulge.    Currently in Pain? No/denies  pain with work   Pain Location Back   Pain Orientation Posterior   Pain Descriptors / Indicators Dull;Aching;Numbness   Pain Type Acute pain   Pain Onset More than a month ago   Pain Frequency Intermittent   Aggravating Factors  lifting bending   Pain Relieving Factors TENS unit,  medication,    Multiple Pain Sites No            [facility] PT Assessment - 10/11/16 0001      Assessment   Medical Diagnosis chronic midline LBO without sciatica   Referring Provider Dr Norton Blizzard    Hand Dominance Right   Next MD Visit 5 weeks   Prior Therapy for pelvic floor     Restrictions   Weight Bearing Restrictions No     Balance Screen   Has the patient fallen in the past 6 months No   Has the patient had a decrease in activity level because of a fear of falling?  No   Is the patient reluctant to leave their home because of a fear of falling?  No     Home Tourist information centre manager residence     Prior Function   Level of Independence Independent   Vocation Full time employment   Vocation Requirements ED nurse   Leisure go for walks with dog;  hang out with friends, work a lot of overtime     Cognition   Overall Cognitive Status Within Functional Limits for tasks assessed     Observation/Other Assessments   Focus on Therapeutic Outcomes (FOTO)  43% limited     Posture/Postural Control   Posture Comments RT scapula more posterior, LT shoulder slightly higher, Mild lt shiftr and RT paraspinals more prominent     AROM   Lumbar Flexion 55   Lumbar Extension 30   Lumbar - Right Side Bend 35  Lumbar - Left Side Bend 30     Strength   Right Hip Extension 5/5   Right Hip External Rotation  5/5   Right Hip Internal Rotation 5/5   Right Hip ABduction 5/5   Left Hip Extension 5/5   Lumbar Flexion 4+/5     Flexibility   Hamstrings bil decreased length about 60 degrees available ROM     Palpation   Palpation comment tender L5 with PA      Prone Knee Bend Test   Findings Negative     Straight Leg Raise   Findings Negative            Objective measurements completed on examination: See above findings.                  PT Education - 10/11/16 1058    Education provided Yes   Education Details POC   Person(s) Educated Patient   Methods Explanation   Comprehension Verbalized understanding          PT Short Term Goals - 10/11/16 1051      PT SHORT TERM GOAL #1   Title The patient will report an understanding of  initial HEP and knowledge of basic postural correction and body mechanics for work  08/10/16   Time 3   Period Weeks   Status New     PT SHORT TERM GOAL #2   Title PT report LBP decr 30% with home and work activity   Time 3   Period Weeks   Status New     PT SHORT TERM GOAL #3   Title The patient will report ability to do moderate lifting at work 20# with minimal pain   Time 3   Period Weeks   Status New           PT Long Term Goals - 10/11/16 1052      PT LONG TERM GOAL #1   Title The patient will be independent in safe self progression of HEP needed for further improvements in core strength   09/07/16   Time 6   Period Weeks   Status New     PT LONG TERM GOAL #2   Title He will report pain decreased 50% or more with work and home tasks   Time 6   Period Weeks   Status New     PT LONG TERM GOAL #3   Title Patient will report an overall improvement of pain with lifting 40# or more    Time 6   Period Weeks   Status New     PT LONG TERM GOAL #4   Title FOTO functional outcome score improved from 43% limitation to <20% indicating improved function with less pain   Time 6   Period Weeks   Status New                Plan - 10/11/16 1059    Clinical Impression Statement Mr Manetta presents with exacerbation of LBP that had resolved 2 months ago. MRI shows disc bulge.   He is limited at work lifting and bending.   He has some mild postural changes., stiffness of spine and mild core weakness ,   Clinical Presentation Stable   Clinical Decision Making Low   Rehab Potential Good   PT Frequency 1x / week   PT Duration 6 weeks   PT Treatment/Interventions ADLs/Self Care Home Management;Electrical Stimulation;Cryotherapy;Moist Heat;Traction;Ultrasound;Patient/family education;Neuromuscular re-education;Therapeutic exercise;Therapeutic activities;Manual techniques;Taping;Dry needling   PT Next  Visit Plan Review curent HEP , modify if needed and add for core stability,  modalities if needed   PT Home Exercise Plan Current HEP   Consulted and Agree with Plan of Care Patient      Patient will benefit from skilled therapeutic intervention in order to improve the following deficits and impairments:  Pain, Decreased strength, Decreased range of motion, Postural dysfunction, Decreased activity tolerance  Visit Diagnosis: Chronic bilateral low back pain without sciatica - Plan: PT plan of care cert/re-cert  Muscle weakness (generalized) - Plan: PT plan of care cert/re-cert  Muscle spasm of back - Plan: PT plan of care cert/re-cert  Abnormal posture - Plan: PT plan of care cert/re-cert     Problem List Patient Active Problem List   Diagnosis Date Noted  . Low back pain 06/30/2016  . Pelvic pain in male 10/14/2015    Caprice Red  PT 10/11/2016, 11:08 AM  [facility] [address] [place], [place], [address] Phone: [phone]   Fax:  [phone]  Name: BRONC BROSSEAU MRN: [id number] Date of Birth: 1985-06-18

## 2016-10-13 ENCOUNTER — Encounter: Payer: Self-pay | Admitting: Family Medicine

## 2016-10-19 ENCOUNTER — Ambulatory Visit: Payer: 59

## 2016-10-19 DIAGNOSIS — N3010 Interstitial cystitis (chronic) without hematuria: Secondary | ICD-10-CM | POA: Diagnosis not present

## 2016-10-19 DIAGNOSIS — M62830 Muscle spasm of back: Secondary | ICD-10-CM

## 2016-10-19 DIAGNOSIS — M6281 Muscle weakness (generalized): Secondary | ICD-10-CM

## 2016-10-19 DIAGNOSIS — M545 Low back pain: Secondary | ICD-10-CM | POA: Diagnosis not present

## 2016-10-19 DIAGNOSIS — M549 Dorsalgia, unspecified: Secondary | ICD-10-CM | POA: Diagnosis not present

## 2016-10-19 DIAGNOSIS — R293 Abnormal posture: Secondary | ICD-10-CM

## 2016-10-19 DIAGNOSIS — G8929 Other chronic pain: Secondary | ICD-10-CM

## 2016-10-19 DIAGNOSIS — G43109 Migraine with aura, not intractable, without status migrainosus: Secondary | ICD-10-CM | POA: Diagnosis not present

## 2016-10-19 MED FILL — predniSONE 10 MG TABS: 10 | 12 days supply | Qty: 42 | Fill #0

## 2016-10-19 NOTE — Therapy (Signed)
[facility] Outpatient Rehabilitation [facility] [address] [place], [place], [address] Phone: [phone]   Fax:  [phone]  Physical Therapy Treatment  Patient Details  Name: Dominic Gibson MRN: [id number] Date of Birth: [birth year] Referring Provider: Dr Norton Blizzard  Encounter Date: 10/19/2016      PT End of Session - 10/19/16 1029    Visit Number 2   Number of Visits 6   Date for PT Re-Evaluation 11/19/16   Authorization Type UMR   PT Start Time 1017   PT Stop Time 1100   PT Time Calculation (min) 43 min   Activity Tolerance Patient tolerated treatment well   Behavior During Therapy [facility] for tasks assessed/performed      Past Medical History:  Diagnosis Date  . Allergy     Past Surgical History:  Procedure Laterality Date  . COSMETIC SURGERY      There were no vitals filed for this visit.      Subjective Assessment - 10/19/16 1025    Subjective Worse back spasm after last session. Even with TENS. He will have some testing due to tingling in Lt lower leg and foot and RT foot and RT hand.  Has history of migraines ( had one 3 days  earlier)      Currently in Pain? No/denies                         [facility] Adult PT Treatment/Exercise - 10/19/16 0001      Lumbar Exercises: Stretches   Double Knee to Chest Stretch 1 rep;30 seconds   Press Ups 5 reps   Press Ups Limitations L5 pressure /discomfort   Quadruped Mid Back Stretch Limitations prayer stretch 30 sec and 20 sec RT and LT sidebend     Lumbar Exercises: Supine   Bent Knee Raise 10 reps;3 seconds   Bent Knee Raise Limitations RT and LT    Bridge 10 reps;5 seconds   Bridge Limitations shoulder bridge cued and demo for technique     Lumbar Exercises: Sidelying   Clam 10 reps;5 seconds   Clam Limitations RT and LT    cued for technique     Lumbar Exercises: Quadruped   Opposite Arm/Leg Raise Right arm/Left leg;Left arm/Right leg;15 reps;3 seconds   Opposite Arm/Leg  Raise Limitations cued to elongate and keep pelvis level, minor adjustments                PT Education - 10/19/16 1101    Education provided Yes   Education Details HEP   Person(s) Educated Patient   Methods Explanation;Demonstration;Tactile cues;Verbal cues;Handout   Comprehension Returned demonstration;Verbalized understanding          PT Short Term Goals - 10/11/16 1051      PT SHORT TERM GOAL #1   Title The patient will report an understanding of initial HEP and knowledge of basic postural correction and body mechanics for work  08/10/16   Time 3   Period Weeks   Status New     PT SHORT TERM GOAL #2   Title PT report LBP decr 30% with home and work activity   Time 3   Period Weeks   Status New     PT SHORT TERM GOAL #3   Title The patient will report ability to do moderate lifting at work 20# with minimal pain   Time 3   Period Weeks   Status New  PT Long Term Goals - 10/11/16 1052      PT LONG TERM GOAL #1   Title The patient will be independent in safe self progression of HEP needed for further improvements in core strength   09/07/16   Time 6   Period Weeks   Status New     PT LONG TERM GOAL #2   Title He will report pain decreased 50% or more with work and home tasks   Time 6   Period Weeks   Status New     PT LONG TERM GOAL #3   Title Patient will report an overall improvement of pain with lifting 40# or more    Time 6   Period Weeks   Status New     PT LONG TERM GOAL #4   Title FOTO functional outcome score improved from 43% limitation to <20% indicating improved function with less pain   Time 6   Period Weeks   Status New               Plan - 10/19/16 1030    Clinical Impression Statement Odd pain exacerbation post eval but resolved and able to do todays session without incr pain and with tingle in Lt leg gone. Will progress HEP next visit    PT Treatment/Interventions ADLs/Self Care Home Management;Electrical  Stimulation;Cryotherapy;Moist Heat;Traction;Ultrasound;Patient/family education;Neuromuscular re-education;Therapeutic exercise;Therapeutic activities;Manual techniques;Taping;Dry needling   PT Next Visit Plan Review curent HEP , modify if needed and add for core stability, modalities if needed   PT Home Exercise Plan Current HEP, shoulder bridge and clam   Consulted and Agree with Plan of Care Patient      Patient will benefit from skilled therapeutic intervention in order to improve the following deficits and impairments:  Pain, Decreased strength, Decreased range of motion, Postural dysfunction, Decreased activity tolerance  Visit Diagnosis: Chronic bilateral low back pain without sciatica  Muscle weakness (generalized)  Muscle spasm of back  Abnormal posture     Problem List Patient Active Problem List   Diagnosis Date Noted  . Low back pain 06/30/2016  . Pelvic pain in male 10/14/2015    Caprice Red  PT 10/19/2016, 12:31 PM  [facility] Outpatient Rehabilitation [facility] [address] [place], [place], [address] Phone: [phone]   Fax:  [phone]  Name: OLUWATOSIN HIGGINSON MRN: [id number] Date of Birth: [birth year]

## 2016-10-19 NOTE — Patient Instructions (Signed)
Issued Pilates based shoulder bridge and clamshells  Daily 10-15 reps 1-3 sec holding

## 2016-10-25 ENCOUNTER — Telehealth: Payer: Self-pay | Admitting: Family Medicine

## 2016-10-25 NOTE — Telephone Encounter (Signed)
Ok to put in neurology referral (full referral, NOT just for NCV/EMGs) - see our email discussions and his last office note for details.  He's had trouble getting in with his PCP to get a referral.  Having numbness and tingling in both hands, left shin not accounted for by lumbar disc herniation we've seen him for.  Also with headache but I don't think this is related to referral evaluation.  Thanks!

## 2016-10-25 NOTE — Telephone Encounter (Signed)
Patient would like a referral to [facility] to see Dr. Nita Sickle

## 2016-10-26 ENCOUNTER — Ambulatory Visit: Payer: 59

## 2016-10-26 DIAGNOSIS — M6281 Muscle weakness (generalized): Secondary | ICD-10-CM

## 2016-10-26 DIAGNOSIS — M545 Low back pain, unspecified: Secondary | ICD-10-CM

## 2016-10-26 DIAGNOSIS — G8929 Other chronic pain: Secondary | ICD-10-CM

## 2016-10-26 DIAGNOSIS — M62830 Muscle spasm of back: Secondary | ICD-10-CM | POA: Diagnosis not present

## 2016-10-26 DIAGNOSIS — R293 Abnormal posture: Secondary | ICD-10-CM | POA: Diagnosis not present

## 2016-10-26 NOTE — Patient Instructions (Signed)
Issued from cabinet side lye hip abduction , abd with IR ER , with flex ext  Leg straight 1x/day 5-10 reps

## 2016-10-26 NOTE — Telephone Encounter (Signed)
Referral sent 

## 2016-10-26 NOTE — Therapy (Signed)
[facility] Outpatient Rehabilitation [facility] [address] [place], [place], [address] Phone: [phone]   Fax:  [phone]  Physical Therapy Treatment  Patient Details  Name: Dominic Gibson MRN: [id number] Date of Birth: Jul 01, 1985 Referring Provider: Dr Norton Blizzard  Encounter Date: 10/26/2016      PT End of Session - 10/26/16 1337    Visit Number 3   Number of Visits 6   Date for PT Re-Evaluation 11/19/16   PT Start Time 0130   PT Stop Time 0215   PT Time Calculation (min) 45 min   Activity Tolerance Patient tolerated treatment well   Behavior During Therapy [facility] for tasks assessed/performed      Past Medical History:  Diagnosis Date  . Allergy     Past Surgical History:  Procedure Laterality Date  . COSMETIC SURGERY      There were no vitals filed for this visit.      Subjective Assessment - 10/26/16 1333    Subjective No worse.  Pain with extension so asked to stop.  Questions about exercises   Currently in Pain? No/denies      There Exer: reviewed all HEP and corrected as needed and added leg and pelvic movements to bridge. Added hip side lye exercise and  Did quadr aped knee raise and raises with leg extension.                           PT Education - 10/26/16 1403    Education provided Yes   Education Details side lye hip exer   Person(s) Educated Patient   Methods Explanation;Demonstration;Tactile cues;Verbal cues;Handout   Comprehension Returned demonstration;Verbalized understanding          PT Short Term Goals - 10/26/16 1414      PT SHORT TERM GOAL #1   Title The patient will report an understanding of initial HEP and knowledge of basic postural correction and body mechanics for work  08/10/16   Status Achieved           PT Long Term Goals - 10/11/16 1052      PT LONG TERM GOAL #1   Title The patient will be independent in safe self progression of HEP needed for further improvements in core  strength   09/07/16   Time 6   Period Weeks   Status New     PT LONG TERM GOAL #2   Title He will report pain decreased 50% or more with work and home tasks   Time 6   Period Weeks   Status New     PT LONG TERM GOAL #3   Title Patient will report an overall improvement of pain with lifting 40# or more    Time 6   Period Weeks   Status New     PT LONG TERM GOAL #4   Title FOTO functional outcome score improved from 43% limitation to <20% indicating improved function with less pain   Time 6   Period Weeks   Status New               Plan - 10/26/16 1338    Clinical Impression Statement Doing well with HEp and appears extension exer are not appropriate to extremes.  Will keep most mat exer more flexion based.    PT Treatment/Interventions ADLs/Self Care Home Management;Electrical Stimulation;Cryotherapy;Moist Heat;Traction;Ultrasound;Patient/family education;Neuromuscular re-education;Therapeutic exercise;Therapeutic activities;Manual techniques;Taping;Dry needling   PT Next Visit Plan Review curent HEP , modify  if needed and add for core stability, modalities if needed, Reformer.   PT Home Exercise Plan Current HEP, shoulder bridge and clam   Consulted and Agree with Plan of Care Patient      Patient will benefit from skilled therapeutic intervention in order to improve the following deficits and impairments:  Pain, Decreased strength, Decreased range of motion, Postural dysfunction, Decreased activity tolerance  Visit Diagnosis: Chronic bilateral low back pain without sciatica  Muscle weakness (generalized)  Muscle spasm of back  Abnormal posture     Problem List Patient Active Problem List   Diagnosis Date Noted  . Low back pain 06/30/2016  . Pelvic pain in male 10/14/2015    Caprice RedChasse, Hally Colella M  PT 10/26/2016, 3:40 PM  [facility] [address] [place], [place], [address] Phone: [phone]   Fax:   [phone]  Name: Dominic Gibson MRN: [id number] Date of Birth: Aug 30, 1985

## 2016-10-26 NOTE — Addendum Note (Signed)
Addended by: Kathi SimpersWISE, Klaire Court F on: 10/26/2016 09:50 AM   Modules accepted: Orders

## 2016-10-27 ENCOUNTER — Encounter: Payer: Self-pay | Admitting: Family Medicine

## 2016-11-02 ENCOUNTER — Ambulatory Visit: Payer: 59 | Admitting: Physical Therapy

## 2016-11-02 ENCOUNTER — Encounter: Payer: Self-pay | Admitting: Physical Therapy

## 2016-11-02 DIAGNOSIS — M6281 Muscle weakness (generalized): Secondary | ICD-10-CM | POA: Diagnosis not present

## 2016-11-02 DIAGNOSIS — M545 Low back pain: Principal | ICD-10-CM

## 2016-11-02 DIAGNOSIS — G8929 Other chronic pain: Secondary | ICD-10-CM | POA: Diagnosis not present

## 2016-11-02 DIAGNOSIS — M62830 Muscle spasm of back: Secondary | ICD-10-CM | POA: Diagnosis not present

## 2016-11-02 DIAGNOSIS — R293 Abnormal posture: Secondary | ICD-10-CM | POA: Diagnosis not present

## 2016-11-02 NOTE — Therapy (Signed)
[facility] [address] [place], [place], [address] Phone: [phone]   Fax:  [phone]  Physical Therapy Treatment  Patient Details  Name: Dominic Gibson MRN: [id number] Date of Birth: 10/26/1985 Referring Provider: Dr Norton BlizzardShane Hudnall  Encounter Date: 11/02/2016      PT End of Session - 11/02/16 1348    Visit Number 4   Number of Visits 6   Date for PT Re-Evaluation 11/19/16   Authorization Type UMR   PT Start Time 1330   PT Stop Time 1415   PT Time Calculation (min) 45 min   Activity Tolerance Patient tolerated treatment well   Behavior During Therapy [facility] for tasks assessed/performed      Past Medical History:  Diagnosis Date  . Allergy     Past Surgical History:  Procedure Laterality Date  . COSMETIC SURGERY      There were no vitals filed for this visit.      Subjective Assessment - 11/02/16 1345    Subjective Its always there.  I am doing everything I can to keep this in check.  Does HEP 1 hour per day after walking the dog.    Currently in Pain? Yes   Pain Score 1    Pain Location Back   Pain Orientation Lower   Pain Descriptors / Indicators Aching;Discomfort   Pain Type Chronic pain   Pain Onset More than a month ago   Pain Frequency Intermittent   Aggravating Factors  lifting and bending   Pain Relieving Factors stretching, meds, heat    Effect of Pain on Daily Activities overcautious with work and activity                 [facility] Adult PT Treatment/Exercise - 11/02/16 0001      Self-Care   Self-Care Other Self-Care Comments   Other Self-Care Comments  PIlates concepts and HEP Q and A          Pilates Reformer used for LE/core strength, postural strength, lumbopelvic disassociation and core control.  Exercises included: Footwork 2 Red 1 blue parallel and hip ER .  Heels and forefoot , cued for full knee extension, midline.  Bridging x 10 good articulation, no pain.    Add clam x  10   Single leg bridge x 5 , min cues Rt. Easier than L    Feet in Straps 1 Red 1 yellow Arcs and Squats   Quadruped 1 Blue spring  UE x 8 and then LE x 8 cues to avoid thoracic flexion.  Standing spine stretch , hamstring  1 Blue for elongation of spine      PT Education - 11/02/16 2035    Education provided Yes   Education Details Pilates and HEP cues    Person(s) Educated Patient   Methods Explanation;Demonstration   Comprehension Verbalized understanding;Returned demonstration          PT Short Term Goals - 11/02/16 2036      PT SHORT TERM GOAL #1   Title The patient will report an understanding of initial HEP and knowledge of basic postural correction and body mechanics for work    Status Achieved     PT SHORT TERM GOAL #2   Title PT report LBP decr 30% with home and work activity   Status On-going     PT SHORT TERM GOAL #3   Title The patient will report ability to do moderate lifting at work 20# with minimal pain  Status On-going           PT Long Term Goals - 11/02/16 2036      PT LONG TERM GOAL #1   Title The patient will be independent in safe self progression of HEP needed for further improvements in core strength     Status On-going     PT LONG TERM GOAL #2   Title He will report pain decreased 50% or more with work and home tasks   Status On-going     PT LONG TERM GOAL #3   Title Patient will report an overall improvement of pain with lifting 40# or more    Status On-going     PT LONG TERM GOAL #4   Title FOTO functional outcome score improved from 43% limitation to <20% indicating improved function with less pain   Status Unable to assess               Plan - 11/02/16 2037    Clinical Impression Statement Pt maintaining a min level of pain overall.  Plans to try the Pilates mat class for low back pain beginning in Nov.  Definite flexion preference.  Needed min cues to in quadruped and modified springs to complete a fairly  challenging core exercise on the Reformer. Pain unchanged after session.      PT Next Visit Plan Review curent HEP , modify if needed and add for core stability, modalities if needed, Reformer: supine arms, abd. Flyer/info on Pilates class.    PT Home Exercise Plan Current HEP, shoulder bridge and clam   Consulted and Agree with Plan of Care Patient      Patient will benefit from skilled therapeutic intervention in order to improve the following deficits and impairments:  Pain, Decreased strength, Decreased range of motion, Postural dysfunction, Decreased activity tolerance  Visit Diagnosis: Chronic bilateral low back pain without sciatica  Muscle weakness (generalized)  Muscle spasm of back  Abnormal posture     Problem List Patient Active Problem List   Diagnosis Date Noted  . Low back pain 06/30/2016  . Pelvic pain in male 10/14/2015    Keiry Kowal 11/02/2016, 8:41 PM  [facility] [address] [place], [place], [address] Phone: [phone]   Fax:  [phone]  Name: Dominic Gibson MRN: [id number] Date of Birth: 07-May-1985    [name], PT 11/02/16 8:43 PM Phone: [phone] Fax: [phone]

## 2016-11-09 ENCOUNTER — Ambulatory Visit: Payer: 59 | Attending: Family Medicine | Admitting: Physical Therapy

## 2016-11-09 ENCOUNTER — Encounter: Payer: Self-pay | Admitting: Physical Therapy

## 2016-11-09 DIAGNOSIS — M545 Low back pain: Secondary | ICD-10-CM | POA: Diagnosis not present

## 2016-11-09 DIAGNOSIS — M6281 Muscle weakness (generalized): Secondary | ICD-10-CM | POA: Diagnosis not present

## 2016-11-09 DIAGNOSIS — R293 Abnormal posture: Secondary | ICD-10-CM | POA: Insufficient documentation

## 2016-11-09 DIAGNOSIS — G8929 Other chronic pain: Secondary | ICD-10-CM | POA: Insufficient documentation

## 2016-11-09 DIAGNOSIS — M62830 Muscle spasm of back: Secondary | ICD-10-CM | POA: Insufficient documentation

## 2016-11-09 MED FILL — AMITRIPTYLINE HCL 10 MG TAB: 10 | 30 days supply | Qty: 30 | Fill #5

## 2016-11-09 NOTE — Therapy (Signed)
[facility] [address] [place], [place], [address] Phone: [phone]   Fax:  [phone]  Physical Therapy Treatment  Patient Details  Name: Dominic Gibson MRN: [id number] Date of Birth: 1985-12-08 Referring Provider: Dr Norton BlizzardShane Hudnall   Encounter Date: 11/09/2016  PT End of Session - 11/09/16 1432    Visit Number  5    Number of Visits  6    Date for PT Re-Evaluation  11/19/16    PT Start Time  1419    PT Stop Time  1504    PT Time Calculation (min)  45 min    Activity Tolerance  Patient tolerated treatment well    Behavior During Therapy  [facility] for tasks assessed/performed       Past Medical History:  Diagnosis Date  . Allergy     Past Surgical History:  Procedure Laterality Date  . COSMETIC SURGERY      There were no vitals filed for this visit.  Subjective Assessment - 11/09/16 1425    Subjective  I just feel really stiff today.  Been taking breaks from the back brace at work.  Been working [employer]. Neuro testing last week, shin pain at work, "could be psychosomatic"    Currently in Pain?  No/denies stiffness   stiffness          [facility] Adult PT Treatment/Exercise - 11/09/16 0001      Lumbar Exercises: Stretches   Quad Stretch  3 reps;20 seconds foam roller    foam roller      Lumbar Exercises: Supine   Bridge  10 reps    Bridge Limitations  added hip flex and ext for stability       Lumbar Exercises: Quadruped   Madcat/Old Horse  10 reps    Other Quadruped Lumbar Exercises  was, lateral flexion and childs pose FW and lateral        Pilates Tower for LE/Core strength, postural strength, lumbopelvic disassociation and core control.  Exercises included:  Supine Leg Springs yellow    Single leg arcs and single leg circles good concentration and control    Sidelying Leg Springs hip abd and add, then hip flex and ext , added combo ext/abd for glute med  Roll down yellow bar, good articulation  eccentrically , lower ab weakness with concentric  Cat Kneeling/Seated for spinal stretching into flexion and extension       PT Education - 11/09/16 1431    Education provided  Yes    Education Details  stability, spinal articulation     Person(s) Educated  Patient    Methods  Explanation    Comprehension  Verbalized understanding       PT Short Term Goals - 11/02/16 2036      PT SHORT TERM GOAL #1   Title  The patient will report an understanding of initial HEP and knowledge of basic postural correction and body mechanics for work     Status  Achieved      PT SHORT TERM GOAL #2   Title  PT report LBP decr 30% with home and work activity    Status  On-going      PT SHORT TERM GOAL #3   Title  The patient will report ability to do moderate lifting at work 20# with minimal pain    Status  On-going        PT Long Term Goals - 11/09/16 1437      PT LONG TERM  GOAL #1   Title  The patient will be independent in safe self progression of HEP needed for further improvements in core strength      Status  On-going      PT LONG TERM GOAL #2   Title  He will report pain decreased 50% or more with work and home tasks    Status  On-going      PT LONG TERM GOAL #3   Title  Patient will report an overall improvement of pain with lifting 40# or more     Status  On-going      PT LONG TERM GOAL #4   Title  FOTO functional outcome score improved from 43% limitation to <20% indicating improved function with less pain    Status  Unable to assess            Plan - 11/09/16 2030    Clinical Impression Statement  Pt with no pain today and has been taking less pain meds at work.  Good body awareness.  Tends towards hyperextension with fatigue in quadruped.  No pain post, felt great.      PT Next Visit Plan  ?DC vs renew, FOTO, ask specific goals. Pilates     PT Home Exercise Plan  Current HEP, shoulder bridge and clam, quadped stretching        Patient will benefit from  skilled therapeutic intervention in order to improve the following deficits and impairments:  Pain, Decreased strength, Decreased range of motion, Postural dysfunction, Decreased activity tolerance  Visit Diagnosis: Chronic bilateral low back pain without sciatica  Muscle weakness (generalized)  Muscle spasm of back  Abnormal posture     Problem List Patient Active Problem List   Diagnosis Date Noted  . Low back pain 06/30/2016  . Pelvic pain in male 10/14/2015    Bayleigh Loflin 11/09/2016, 8:32 PM  [facility] [address] [place], [place], [address] Phone: [phone]   Fax:  [phone]  Name: Dominic Gibson MRN: [id number] Date of Birth: [birth year]  [name], PT 11/09/16 8:32 PM Phone: [phone] Fax: [phone]

## 2016-11-11 ENCOUNTER — Encounter: Payer: Self-pay | Admitting: Neurology

## 2016-11-11 ENCOUNTER — Ambulatory Visit (INDEPENDENT_AMBULATORY_CARE_PROVIDER_SITE_OTHER): Payer: 59 | Admitting: Neurology

## 2016-11-11 VITALS — BP 118/72 | HR 73 | Ht 75.0 in | Wt 184.0 lb

## 2016-11-11 DIAGNOSIS — R200 Anesthesia of skin: Secondary | ICD-10-CM

## 2016-11-11 DIAGNOSIS — R202 Paresthesia of skin: Secondary | ICD-10-CM | POA: Diagnosis not present

## 2016-11-11 DIAGNOSIS — G43109 Migraine with aura, not intractable, without status migrainosus: Secondary | ICD-10-CM | POA: Diagnosis not present

## 2016-11-11 NOTE — Patient Instructions (Signed)
1. Schedule MRI brain with and without contrast 2. Schedule MRI cervical spine with and without contrast 3. Our office will call you for results, if normal, we will get you scheduled for an EMG/NCV (nerve test) with Dr. Allena KatzPatel 4. Follow-up after tests, call for any changes

## 2016-11-11 NOTE — Progress Notes (Signed)
NEUROLOGY CONSULTATION NOTE  Dominic Gibson MRN: [id number] DOB: 05/26/1985  Referring provider: Dr. Norton Blizzard Primary care provider: Dr. Maurice Small  Reason for consult:  Numbness/tingling, headache  Dear Dr Pearletha Forge:  Thank you for your kind referral of Dominic Gibson for consultation of the above symptoms. Although his history is well known to you, please allow me to reiterate it for the purpose of our medical record. Records and images were personally reviewed where available.  HISTORY OF PRESENT ILLNESS: This is a pleasant 31 year old right-handed man with a history of ADD, pelvic floor spasms, presenting for evaluation of paresthesias and headache. He started having back pain after a work incident in August 2017 and wore a back brace for a few months. Symptoms resolved, then in May 2018, he started having lower back spasms in the same region of L5-S1. He did physical therapy and pain resolved. He started having localized numbness in his lower back in September 2018, just above the mid-buttock region, worse when he would bend down at work. He did not seem to notice symptoms outside of work. This went on intermittently for a brief period and has since resolved. He had back imaging with MRI lumbar spine showing right subarticular disc protrusion at L5-S1, contacting the descending S1 nerve root in the right lateral recess. He has a history of rare complicated migraines affecting his right side, mostly occurring in stressful times (last episode was 2.5 years ago), then on 10/08/16, he felt the similar aura on his right side with partial numbness on the right side of his face and half of his tongue. Foot was not affected. He did not have much headache but felt "kind of hungover." There was some pain with change in position, but he also felt confused. He was at work and said aspirin instead of Ibuprofen. He was having difficulty recalling names. This lasted 2-2.5 hours, followed by a dull  right-sided headache. No nausea/vomiting, photo/phonophobia. The intermittent right hand numbness lasting for hours has still continued, but for the past 3 days, his left hand also started feeling numb and "fat." His left shin felt numb briefly at work. His skin feels sensitive, both hands intermittently feel "kind of itchy," with numbness and tingling in his fingertips up to the wrists. He has had some neck pain since the MRI last September. He was given a prednisone taper 2 weeks ago by his PCP which caused anxiety and did not help much. He felt the numbness in his hands improved. He uses Tylenol and Ibuprofen for his back.   He saw [facility] at age 24 for the complicated migraines. At that point, he recalls having a head CT with contrast which was normal. He did not get another migraine until a year later. He has blurred vision when staring at a computer screen for a long time. No loss of vision. He denies any bowel dysfunction. He has a history of pelvic floor muscle spasms treated with amitriptyline and hydroxyzine since November 2017. He has constant urgency and a little dysuria. No diplopia, dysarthria/dysphagia. His mother has migraines. No family history of MS.    PAST MEDICAL HISTORY: Past Medical History:  Diagnosis Date  . Allergy     PAST SURGICAL HISTORY: Past Surgical History:  Procedure Laterality Date  . COSMETIC SURGERY      MEDICATIONS: Current Outpatient Medications on File Prior to Visit  Medication Sig Dispense Refill  . alfuzosin (UROXATRAL) 10 MG 24 hr tablet     .  amitriptyline (ELAVIL) 10 MG tablet     . amphetamine-dextroamphetamine (ADDERALL) 20 MG tablet     . cyclobenzaprine (FLEXERIL) 10 MG tablet     . EPINEPHrine (EPIPEN 2-PAK) 0.3 mg/0.3 mL IJ SOAJ injection     . hydrOXYzine (ATARAX/VISTARIL) 25 MG tablet     . zolpidem (AMBIEN) 10 MG tablet      No current facility-administered medications on file prior to visit.     ALLERGIES: Allergies   Allergen Reactions  . Shellfish-Derived Products Other (See Comments)  . Sulfa Antibiotics Rash    FAMILY HISTORY: Family History  Problem Relation Age of Onset  . Dementia Maternal Grandmother   . Parkinsonism Maternal Grandfather     SOCIAL HISTORY: Social History   Socioeconomic History  . Marital status: Single    Spouse name: Not on file  . Number of children: Not on file  . Years of education: Not on file  . Highest education level: Not on file  Social Needs  . Financial resource strain: Not on file  . Food insecurity - worry: Not on file  . Food insecurity - inability: Not on file  . Transportation needs - medical: Not on file  . Transportation needs - non-medical: Not on file  Occupational History  . Not on file  Tobacco Use  . Smoking status: Former Games developer  . Smokeless tobacco: Never Used  Substance and Sexual Activity  . Alcohol use: Yes  . Drug use: No  . Sexual activity: Not on file  Other Topics Concern  . Not on file  Social History Narrative  . Not on file    REVIEW OF SYSTEMS: Constitutional: No fevers, chills, or sweats, no generalized fatigue, change in appetite Eyes: No visual changes, double vision, eye pain Ear, nose and throat: No hearing loss, ear pain, nasal congestion, sore throat Cardiovascular: No chest pain, palpitations Respiratory:  No shortness of breath at rest or with exertion, wheezes GastrointestinaI: No nausea, vomiting, diarrhea, abdominal pain, fecal incontinence Genitourinary:  No dysuria, urinary retention or frequency Musculoskeletal:  No neck pain, back pain Integumentary: No rash, pruritus, skin lesions Neurological: as above Psychiatric: No depression, insomnia, anxiety Endocrine: No palpitations, fatigue, diaphoresis, mood swings, change in appetite, change in weight, increased thirst Hematologic/Lymphatic:  No anemia, purpura, petechiae. Allergic/Immunologic: no itchy/runny eyes, nasal congestion, recent allergic  reactions, rashes  PHYSICAL EXAM: Vitals:   11/11/16 0902  BP: 118/72  Pulse: 73  SpO2: 98%   General: No acute distress Head:  Normocephalic/atraumatic Eyes: Fundoscopic exam shows bilateral sharp discs, no vessel changes, exudates, or hemorrhages Neck: supple, no paraspinal tenderness, full range of motion Back: No paraspinal tenderness Heart: regular rate and rhythm Lungs: Clear to auscultation bilaterally. Vascular: No carotid bruits. Skin/Extremities: No rash, no edema Neurological Exam: Mental status: alert and oriented to person, place, and time, no dysarthria or aphasia, Fund of knowledge is appropriate.  Recent and remote memory are intact.  Attention and concentration are normal.    Able to name objects and repeat phrases. Cranial nerves: CN I: not tested CN II: pupils equal, round and reactive to light, visual fields intact, fundi unremarkable. CN III, IV, VI:  full range of motion, no nystagmus, no ptosis CN V: facial sensation intact CN VII: upper and lower face symmetric CN VIII: hearing intact to finger rub CN IX, X: gag intact, uvula midline CN XI: sternocleidomastoid and trapezius muscles intact CN XII: tongue midline Bulk & Tone: normal, no fasciculations. Motor: 5/5 throughout with no  pronator drift. Sensation: intact to light touch, cold, pin, vibration and joint position sense.  No extinction to double simultaneous stimulation.  Romberg test negative Deep Tendon Reflexes: +2 throughout, no ankle clonus, negative Hoffman sign Plantar responses: downgoing bilaterally Cerebellar: no incoordination on finger to nose, heel to shin. No dysdiadochokinesia Gait: narrow-based and steady, able to tandem walk adequately. Tremor: none Negative Tinel sign at the wrist or elbow  IMPRESSION: This is a pleasant 31 year old right-handed man with a history of ADD, pelvic floor spasms, presenting for evaluation of new onset paresthesias. He was diagnosed with complicated  migraines at age 31 with symptoms of right-sided numbness/tingling. He had a similar episode last month, however continues to have the right hand paresthesias, now affecting his left hand as well. Neurological exam is normal. Differential diagnosis is broad, most concerning in this age group is demyelinating disease, however he is also anxious, which can also cause similar symptoms. MRI brain and cervical spine with and without contrast will be ordered to assess for demyelinating disease. If normal, we will plan for an EMG/NCV of both UE. He will follow-up after the tests and knows to call for any changes.   Thank you for allowing me to participate in the care of this patient. Please do not hesitate to call for any questions or concerns.   Patrcia DollyKaren Briany Aye, M.D.  CC: Dr. Valentina LucksGriffin, Dr. Pearletha ForgeHudnall

## 2016-11-15 ENCOUNTER — Encounter: Payer: Self-pay | Admitting: Neurology

## 2016-11-16 MED FILL — CYCLOBENZAPRINE 10 MG TAB: 10 | 7 days supply | Qty: 20 | Fill #0

## 2016-11-16 MED FILL — DEXTROAMP-AMPHETAMIN 20 MG: 20 | 30 days supply | Qty: 45 | Fill #0

## 2016-11-17 ENCOUNTER — Ambulatory Visit: Payer: 59 | Admitting: Physical Therapy

## 2016-11-17 DIAGNOSIS — M545 Low back pain: Secondary | ICD-10-CM | POA: Diagnosis not present

## 2016-11-17 DIAGNOSIS — M6281 Muscle weakness (generalized): Secondary | ICD-10-CM | POA: Diagnosis not present

## 2016-11-17 DIAGNOSIS — R293 Abnormal posture: Secondary | ICD-10-CM

## 2016-11-17 DIAGNOSIS — M62830 Muscle spasm of back: Secondary | ICD-10-CM

## 2016-11-17 DIAGNOSIS — G8929 Other chronic pain: Secondary | ICD-10-CM

## 2016-11-17 NOTE — Therapy (Signed)
[place], [place], [address] Phone: [phone]   Fax:  [phone]  Physical Therapy Treatment and Discharge   Patient Details  Name: Dominic Gibson MRN: [id number] Date of Birth: 12/07/1985 Referring Provider: Dr Karlton Lemon   Encounter Date: 11/17/2016  PT End of Session - 11/17/16 1327    Visit Number  6    Number of Visits  6    Date for PT Re-Evaluation  11/19/16    Authorization Type  [facility]    PT Start Time  1301    PT Stop Time  1355    PT Time Calculation (min)  54 min    Activity Tolerance  Patient tolerated treatment well    Behavior During Therapy  [facility] for tasks assessed/performed       Past Medical History:  Diagnosis Date  . Allergy     Past Surgical History:  Procedure Laterality Date  . COSMETIC SURGERY      There were no vitals filed for this visit.  Subjective Assessment - 11/17/16 1316    Subjective  Having MRI next week.  Plans to come to [facility].  Pain in neck is worse than back today, Got a new video game, probably postural.      Currently in Pain?  Yes    Pain Score  2     Pain Location  Back    Pain Orientation  Lower    Pain Descriptors / Indicators  Aching    Pain Type  Chronic pain    Pain Onset  More than a month ago    Pain Score  3    Pain Location  Neck    Pain Orientation  Right;Left;Posterior    Pain Type  Acute pain    Pain Onset  In the past 7 days    Pain Frequency  Intermittent                      [facility] Adult PT Treatment/Exercise - 11/17/16 0001      Self-Care   Self-Care  Posture;Other Self-Care Comments    Posture  foraward head, upper crossed syndome    Other Self-Care Comments   HEP, simple neck stretches and foam roller for core, cervical stabilization, [place] info       Lumbar Exercises: Supine   Clam  20 reps    Bent Knee Raise  10 reps    Dead Bug  10 reps    Bridge  10 reps    Straight Leg Raise  10 reps    Other Supine Lumbar Exercises  Foam roller: Arms only dead bug and horiz abd with green band x 10 each       Shoulder Exercises: Supine   Horizontal ABduction  Strengthening;Both;10 reps 6 lb unilat and lat pull down 6lb bilat.       Shoulder Exercises: Prone   Other Prone Exercises  swan with foam roller  scapular weakness      Neck Exercises: Stretches   Upper Trapezius Stretch  2 reps;30 seconds    Levator Stretch  2 reps;30 seconds               PT Short Term Goals - 11/17/16 1335      PT SHORT TERM GOAL #1   Title  The patient will report an understanding of initial HEP and knowledge of basic postural correction and body mechanics for work     Status  Achieved      PT SHORT TERM GOAL #2   Title  PT report LBP decr 30% with home and work activity    Baseline  50%, more intermittent     Status  Achieved      PT SHORT TERM GOAL #3   Title  The patient will report ability to do moderate lifting at work 20# with minimal pain    Status  Achieved        PT Long Term Goals - 11/17/16 1336      PT LONG TERM GOAL #1   Title  The patient will be independent in safe self progression of HEP needed for further improvements in core strength      Status  Achieved      PT LONG TERM GOAL #2   Title  He will report pain decreased 50% or more with work and home tasks    Status  Achieved      PT [place] #3   Title  Patient will report an overall improvement of pain with lifting 40# or more     Status  Achieved      PT LONG TERM GOAL #4   Title  FOTO functional outcome score improved from 43% limitation to <20% indicating improved function with less pain    Baseline  37%    Status  Partially Met            Plan - 11/17/16 1451    Clinical Impression Statement  Patient feels ready for discharge from PT.  He has improved about 50%, still with intermittent min low back pain but has learned how to manage.  He will continue to attend therapeutic Pilates class  weekly to maintain progress.      PT Next Visit Plan  DC, NA     PT Home Exercise Plan  Current HEP, shoulder bridge and clam, quadped stretching , neck stretching     Consulted and Agree with Plan of Care  Patient       Patient will benefit from skilled therapeutic intervention in order to improve the following deficits and impairments:  Pain, Decreased strength, Decreased range of motion, Postural dysfunction, Decreased activity tolerance  Visit Diagnosis: Chronic bilateral low back pain without sciatica  Muscle weakness (generalized)  Muscle spasm of back  Abnormal posture     Problem List Patient Active Problem List   Diagnosis Date Noted  . Low back pain 06/30/2016  . Pelvic pain in male 10/14/2015    Dominic Gibson 11/17/2016, 2:57 PM  [facility] [address] [place], [place], [address] Phone: [phone]   Fax:  [phone]  Name: Dominic Gibson MRN: [id number] Date of Birth: 11/28/1985  PHYSICAL THERAPY DISCHARGE SUMMARY  Visits from Start of Care: 6  Current functional level related to goals / functional outcomes: See above, flare up of neck pain today.    Remaining deficits: Posture, strength and stability , limited function about 37% according to [employer] / Equipment: Posture, Pilates, core  Plan: Patient agrees to discharge.  Patient goals were partially met. Patient is being discharged due to being pleased with the current functional level.  ?????    Raeford Razor, PT 11/17/16 2:58 PM Phone: [phone] Fax: [phone]

## 2016-11-27 ENCOUNTER — Ambulatory Visit
Admission: RE | Admit: 2016-11-27 | Discharge: 2016-11-27 | Disposition: A | Discharge status: Home / Self Care | Payer: 59 | Source: Ambulatory Visit | Attending: Neurology | Admitting: Neurology

## 2016-11-27 DIAGNOSIS — R202 Paresthesia of skin: Secondary | ICD-10-CM

## 2016-11-27 DIAGNOSIS — R200 Anesthesia of skin: Secondary | ICD-10-CM

## 2016-11-27 DIAGNOSIS — G43109 Migraine with aura, not intractable, without status migrainosus: Secondary | ICD-10-CM

## 2016-11-27 DIAGNOSIS — R51 Headache: Secondary | ICD-10-CM | POA: Diagnosis not present

## 2016-11-27 DIAGNOSIS — M542 Cervicalgia: Secondary | ICD-10-CM | POA: Diagnosis not present

## 2016-11-27 MED ORDER — GADOBENATE DIMEGLUMINE 529 MG/ML IV SOLN
17.0000 mL | Freq: Once | INTRAVENOUS | Status: AC | PRN
  Administered 2016-11-27: 17 mL via INTRAVENOUS

## 2016-12-03 ENCOUNTER — Telehealth: Payer: Self-pay

## 2016-12-03 DIAGNOSIS — G35 Multiple sclerosis: Secondary | ICD-10-CM

## 2016-12-03 NOTE — Telephone Encounter (Signed)
Spoke with pt relaying message below.  He would like to proceed with EMG.  Will place orders now.    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

## 2016-12-03 NOTE — Telephone Encounter (Signed)
-----   Message from Van Clines, MD sent at 11/30/2016  9:57 AM EST ----- Pls let him know the MRI brain and cervical spine are normal, no evidence of tumor, stroke, bleed, or MS changes. We had talked about doing a nerve test for the numbness and tingling if the MRI is normal, would he like to proceed? If yes, pls schedule EMG/NCV of both UE with Dr. Allena Katz. Thanks

## 2016-12-07 MED FILL — AMITRIPTYLINE HCL 10 MG TAB: 10 | 30 days supply | Qty: 30 | Fill #6

## 2016-12-08 MED FILL — hydrOXYzine HCL 25 MG TABS: 25 | 30 days supply | Qty: 90 | Fill #0

## 2016-12-09 NOTE — Telephone Encounter (Signed)
Noted Thanks Dana  °

## 2016-12-09 NOTE — Telephone Encounter (Signed)
I have called and left several messages for the patient to call back for [place] to get him on for EMG but he has not returned our calls

## 2016-12-19 ENCOUNTER — Encounter (HOSPITAL_COMMUNITY): Payer: Self-pay

## 2016-12-19 ENCOUNTER — Ambulatory Visit (HOSPITAL_COMMUNITY)
Admission: EM | Admit: 2016-12-19 | Discharge: 2016-12-19 | Disposition: A | Discharge status: Home / Self Care | Payer: 59 | Attending: Internal Medicine | Admitting: Internal Medicine

## 2016-12-19 DIAGNOSIS — Z882 Allergy status to sulfonamides status: Secondary | ICD-10-CM | POA: Insufficient documentation

## 2016-12-19 DIAGNOSIS — Z79899 Other long term (current) drug therapy: Secondary | ICD-10-CM | POA: Diagnosis not present

## 2016-12-19 DIAGNOSIS — Z87891 Personal history of nicotine dependence: Secondary | ICD-10-CM | POA: Diagnosis not present

## 2016-12-19 DIAGNOSIS — J029 Acute pharyngitis, unspecified: Secondary | ICD-10-CM | POA: Insufficient documentation

## 2016-12-19 DIAGNOSIS — Z91013 Allergy to seafood: Secondary | ICD-10-CM | POA: Insufficient documentation

## 2016-12-19 LAB — POCT RAPID STREP A: Streptococcus, Group A Screen (Direct): NEGATIVE

## 2016-12-19 MED ORDER — BENZONATATE 100 MG PO CAPS: 100.0000 mg | ORAL_CAPSULE | Freq: Three times a day (TID) | ORAL | 0 refills | Status: DC

## 2016-12-19 MED ORDER — AZITHROMYCIN 250 MG PO TABS: 250.0000 mg | ORAL_TABLET | Freq: Every day | ORAL | 0 refills | Status: DC

## 2016-12-19 MED ORDER — CETIRIZINE-PSEUDOEPHEDRINE ER 5-120 MG PO TB12: 1.0000 | ORAL_TABLET | Freq: Every day | ORAL | 0 refills | Status: DC

## 2016-12-19 MED ORDER — FLUTICASONE PROPIONATE 50 MCG/ACT NA SUSP: 2.0000 | Freq: Every day | NASAL | 0 refills | Status: DC

## 2016-12-19 NOTE — ED Triage Notes (Signed)
Pt presents today with sore throat, productive cough x5 days. States he has had throat pain, hurts to swallow, has post nasal drainage, no fever that he is aware of, coughing up green mucous and states he does not have his tonsils. Unable to sleep at night and has tried OTC tylenol and ibuprofen as needed with not relief.

## 2016-12-19 NOTE — ED Provider Notes (Signed)
[facility]    CSN: [id number] Arrival date & time: 12/19/16  1205     History   Chief Complaint Chief Complaint  Patient presents with  . Sore Throat    HPI Dominic Gibson is a 31 y.o. male.   31 year old male comes in for 6 day history of sore throat, productive cough, nasal drainage. Denies fever, chills, night sweats. Dysphagia without trouble breathing or swallowing. Has tried salt water gurgles, chloraseptic throat sprat, NSAIDs, Tylenol without relief. 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 Former smoker. Patient with history of tonsillectomy. Patient states given symptoms has been going on for 6 days, would like to be tested for strep just in case.       Past Medical History:  Diagnosis Date  . Allergy     Patient Active Problem List   Diagnosis Date Noted  . Low back pain 06/30/2016  . Pelvic pain in male 10/14/2015    Past Surgical History:  Procedure Laterality Date  . COSMETIC SURGERY         Home Medications    Prior to Admission medications   Medication Sig Start Date End Date Taking? Authorizing Provider  alfuzosin (UROXATRAL) 10 MG 24 hr tablet  06/01/16  Yes [provider]  amitriptyline (ELAVIL) 10 MG tablet  06/08/16  Yes [provider]  amphetamine-dextroamphetamine (ADDERALL) 20 MG tablet  06/18/16  Yes [provider]  cyclobenzaprine (FLEXERIL) 10 MG tablet  06/18/16  Yes [provider]  hydrOXYzine (ATARAX/VISTARIL) 25 MG tablet  06/01/16  Yes [provider]  azithromycin (ZITHROMAX) 250 MG tablet Take 1 tablet (250 mg total) by mouth daily. Take first 2 tablets together, then 1 every day until finished. Please fill on 12/21 if symptoms not improving 12/24/16   Cathie Hoops, Gianpaolo Mindel V, PA-C  cetirizine-pseudoephedrine (ZYRTEC-D) 5-120 MG tablet Take 1 tablet by mouth daily. 12/19/16   Cathie Hoops, Dewitte Vannice V, PA-C  EPINEPHrine (EPIPEN 2-PAK) 0.3 mg/0.3 mL IJ SOAJ injection  04/21/15   [provider]  fluticasone (FLONASE) 50 MCG/ACT nasal spray Place 2 sprays into both nostrils daily. 12/19/16   Belinda Fisher, PA-C    Family History Family History  Problem Relation Age of Onset  . Dementia Maternal Grandmother   . Parkinsonism Maternal Grandfather     Social History Social History   Tobacco Use  . Smoking status: Former Games developer  . Smokeless tobacco: Never Used  Substance Use Topics  . Alcohol use: Yes  . Drug use: No     Allergies   Shellfish-derived products and Sulfa antibiotics   Review of Systems Review of Systems  Reason unable to perform ROS: See HPI as above.     Physical Exam Triage Vital Signs ED Triage Vitals  Enc Vitals Group     BP 12/19/16 1222 123/82     Pulse Rate 12/19/16 1222 93     Resp 12/19/16 1222 16     Temp 12/19/16 1222 98.4 F (36.9 C)     Temp Source 12/19/16 1222 Oral     SpO2 12/19/16 1222 97 %     Weight --      Height --      Head Circumference --      Peak Flow --      Pain Score 12/19/16 1224 5     Pain Loc --      Pain Edu? --      Excl. in GC? --  No data found.  Updated Vital Signs BP 123/82 (BP Location: Left Arm)   Pulse 93   Temp 98.4 F (36.9 C) (Oral)   Resp 16   SpO2 97%   Physical Exam  Constitutional: He is oriented to person, place, and time. He appears well-developed and well-nourished. No distress.  HENT:  Head: Normocephalic and atraumatic.  Right Ear: External ear and ear canal normal. Tympanic membrane is erythematous. Tympanic membrane is not bulging.  Left Ear: External ear and ear canal normal. Tympanic membrane is erythematous. Tympanic membrane is not bulging.  Nose: Nose normal. Right sinus exhibits no maxillary sinus tenderness and no frontal sinus tenderness. Left sinus exhibits no maxillary sinus tenderness and no frontal sinus tenderness.  Mouth/Throat: Uvula is midline and mucous membranes are normal. Posterior oropharyngeal erythema present.  Eyes: Conjunctivae are  normal. Pupils are equal, round, and reactive to light.  Neck: Normal range of motion. Neck supple.  Cardiovascular: Normal rate, regular rhythm and normal heart sounds. Exam reveals no gallop and no friction rub.  No murmur heard. Pulmonary/Chest: Effort normal and breath sounds normal. He has no decreased breath sounds. He has no wheezes. He has no rhonchi. He has no rales.  Lymphadenopathy:    He has no cervical adenopathy.  Neurological: He is alert and oriented to person, place, and time.  Skin: Skin is warm and dry.  Psychiatric: He has a normal mood and affect. His behavior is normal. Judgment normal.     UC Treatments / Results  Labs (all labs ordered are listed, but only abnormal results are displayed) Labs Reviewed  CULTURE, GROUP A STREP [facility])  POCT RAPID STREP A    EKG  EKG Interpretation None       Radiology No results found.  Procedures Procedures (including critical care time)  Medications Ordered in UC Medications - No data to display   Initial Impression / Assessment and Plan / UC Course  I have reviewed the triage vital signs and the nursing notes.  Pertinent labs & imaging results that were available during my care of the patient were reviewed by me and considered in my medical decision making (see chart for details).    Rapid strep negative. Symptomatic treatment as needed. Given duration of symptoms with worsening symptoms, written Rx of azithromycin provided, patient to start taking on 12/21 if symptoms not improving. Return precautions given.    Final Clinical Impressions(s) / UC Diagnoses   Final diagnoses:  Pharyngitis, unspecified etiology    ED Discharge Orders        Ordered    azithromycin (ZITHROMAX) 250 MG tablet  Daily     12/19/16 1302    fluticasone (FLONASE) 50 MCG/ACT nasal spray  Daily     12/19/16 1302    cetirizine-pseudoephedrine (ZYRTEC-D) 5-120 MG tablet  Daily     12/19/16 1302        Belinda FisherYu, Stacy Deshler V,  PA-C 12/19/16 1305

## 2016-12-19 NOTE — Discharge Instructions (Signed)
Rapid strep negative. Sore throat could be more due to post nasal drip. Start Flonase and/or Zyrtec-D for nasal congestion. You can use over the counter nasal saline rinse such as neti pot for nasal congestion. If symptoms not improving in 4 days, start azithromycin on 12/21. Monitor for any worsening of symptoms, swelling of the throat, trouble breathing, trouble swallowing, follow up for reevaluation.   For sore throat try using a honey-based tea. Use 3 teaspoons of honey with juice squeezed from half lemon. Place shaved pieces of ginger into 1/2-1 cup of water and warm over stove top. Then mix the ingredients and repeat every 4 hours as needed.

## 2016-12-21 ENCOUNTER — Telehealth: Payer: 59 | Admitting: Family

## 2016-12-21 DIAGNOSIS — H109 Unspecified conjunctivitis: Secondary | ICD-10-CM | POA: Diagnosis not present

## 2016-12-21 DIAGNOSIS — B9689 Other specified bacterial agents as the cause of diseases classified elsewhere: Secondary | ICD-10-CM

## 2016-12-21 MED ORDER — ERYTHROMYCIN 5 MG/GM OP OINT: 1.0000 "application " | TOPICAL_OINTMENT | Freq: Four times a day (QID) | OPHTHALMIC | 0 refills | Status: DC

## 2016-12-21 MED FILL — DEXTROAMP-AMPHETAMIN 20 MG: 20 | 30 days supply | Qty: 45 | Fill #0

## 2016-12-21 MED FILL — ERYTHROMYCIN EYE OINTMENT: 5 | 5 days supply | Qty: 4 | Fill #0

## 2016-12-21 NOTE — Progress Notes (Signed)

## 2016-12-22 LAB — CULTURE, GROUP A STREP (THRC)

## 2017-01-06 MED FILL — AMITRIPTYLINE HCL 10 MG TAB: 10 | 90 days supply | Qty: 90 | Fill #0

## 2017-01-07 ENCOUNTER — Telehealth: Payer: 59 | Admitting: Family

## 2017-01-07 DIAGNOSIS — H109 Unspecified conjunctivitis: Secondary | ICD-10-CM | POA: Diagnosis not present

## 2017-01-07 MED ORDER — POLYMYXIN B-TRIMETHOPRIM 10000-0.1 UNIT/ML-% OP SOLN: 1.0000 [drp] | OPHTHALMIC | 0 refills | Status: DC

## 2017-01-07 MED FILL — POLYMYXIN B/TMP EYE DROPS: 10000-0.1 | 16 days supply | Qty: 10 | Fill #0

## 2017-01-07 NOTE — Progress Notes (Signed)
Thank you for the details you included in the comment boxes. Those details are very helpful in determining the best course of treatment for you and help [place] to provide the best care. The ointment is very frustrating for many people and that makes patients forget to use it or avoid using it. I'm switching to a better one, polytrim. It is trimethoprim, but NOT sulfamethoxazole like Bactrim so it is not a sulfa antibiotic. Every now and then, people will have a cross-sensitivity from sulfa antibiotics to trimethoprim. However, it is extremely unlikely and I have never personally seen it. From the studies I can find, you have a less than 3% chance of having the cross-sensitivity. Just wanted to let you know. The Polytrim eye drops are very effective.  We are sorry that you are not feeling well.  Here is how we plan to help!  Based on what you have shared with me it looks like you have conjunctivitis.  Conjunctivitis is a common inflammatory or infectious condition of the eye that is often referred to as "pink eye".  In most cases it is contagious (viral or bacterial). However, not all conjunctivitis requires antibiotics (ex. Allergic).  We have made appropriate suggestions for you based upon your presentation.  I have prescribed Polytrim Ophthalmic drops , 1 drop in BOTH eyes, every 4 hours times 5 days (as close to every 4 hours as you can get, aim for 5-6 administrations per day).   Pink eye can be highly contagious.  It is typically spread through direct contact with secretions, or contaminated objects or surfaces that one may have touched.  Strict handwashing is suggested with soap and water is urged.  If not available, use alcohol based had sanitizer.  Avoid unnecessary touching of the eye.  If you wear contact lenses, you will need to refrain from wearing them until you see no white discharge from the eye for at least 24 hours after being on medication.  You should see symptom improvement in 1-2 days after  starting the medication regimen.  Call us if symptoms are not improved in 1-2 days.  Home Care:  Wash your hands often!  Do not wear your contacts until you complete your treatment plan.  Avoid sharing towels, bed linen, personal items with a person who has pink eye.  See attention for anyone in your home with similar symptoms.  Get Help Right Away If:  Your symptoms do not improve.  You develop blurred or loss of vision.  Your symptoms worsen (increased discharge, pain or redness)  Your e-visit answers were reviewed by a board certified advanced clinical practitioner to complete your personal care plan.  Depending on the condition, your plan could have included both over the counter or prescription medications.  If there is a problem please reply  once you have received a response from your provider.  Your safety is important to [place].  If you have drug allergies check your prescription carefully.    You can use MyChart to ask questions about today's visit, request a non-urgent call back, or ask for a work or school excuse for 24 hours related to this e-Visit. If it has been greater than 24 hours you will need to follow up with your provider, or enter a new e-Visit to address those concerns.   You will get an e-mail in the next two days asking about your experience.  I hope that your e-visit has been valuable and will speed your recovery. Thank you for  using e-visits.

## 2017-01-28 MED FILL — AMPHETAMINE SALTS 20 MG TAB: 20 | 30 days supply | Qty: 45 | Fill #0

## 2017-02-02 DIAGNOSIS — H1031 Unspecified acute conjunctivitis, right eye: Secondary | ICD-10-CM | POA: Diagnosis not present

## 2017-02-02 MED FILL — NEO/POLYMYXIN/DEXAMETH DROP: 3.5-10000-0 | 7 days supply | Qty: 5 | Fill #0

## 2017-03-01 MED FILL — hydrOXYzine HCL 25 MG TABS: 25 | 30 days supply | Qty: 90 | Fill #1

## 2017-03-03 MED FILL — CYCLOBENZAPRINE 10 MG TAB: 10 | 7 days supply | Qty: 20 | Fill #0

## 2017-03-03 MED FILL — ZOLPIDEM TARTRATE 10 MG TAB: 10 | 20 days supply | Qty: 20 | Fill #0

## 2017-03-03 MED FILL — AMPHETAMINE-DEXTRO 20MG: 20 | 30 days supply | Qty: 45 | Fill #0

## 2017-03-31 MED FILL — AMITRIPTYLINE HCL 10 MG TAB: 10 | 90 days supply | Qty: 90 | Fill #1

## 2017-04-04 MED FILL — FLUTICASONE PROP 50 MCG SPR: 50 | 60 days supply | Qty: 16 | Fill #0

## 2017-04-06 MED FILL — AMPHETAMINE-DEXTRO 20MG: 20 | 30 days supply | Qty: 45 | Fill #0

## 2017-05-09 ENCOUNTER — Ambulatory Visit (INDEPENDENT_AMBULATORY_CARE_PROVIDER_SITE_OTHER): Payer: 59

## 2017-05-09 ENCOUNTER — Ambulatory Visit (INDEPENDENT_AMBULATORY_CARE_PROVIDER_SITE_OTHER): Payer: 59 | Admitting: Family Medicine

## 2017-05-09 ENCOUNTER — Other Ambulatory Visit: Payer: Self-pay

## 2017-05-09 ENCOUNTER — Encounter: Payer: Self-pay | Admitting: Family Medicine

## 2017-05-09 VITALS — BP 110/70 | HR 99 | Temp 98.1°F | Ht 75.0 in | Wt 182.4 lb

## 2017-05-09 DIAGNOSIS — Z113 Encounter for screening for infections with a predominantly sexual mode of transmission: Secondary | ICD-10-CM

## 2017-05-09 DIAGNOSIS — N342 Other urethritis: Secondary | ICD-10-CM | POA: Diagnosis not present

## 2017-05-09 DIAGNOSIS — G8929 Other chronic pain: Secondary | ICD-10-CM | POA: Diagnosis not present

## 2017-05-09 DIAGNOSIS — R1030 Lower abdominal pain, unspecified: Secondary | ICD-10-CM | POA: Diagnosis not present

## 2017-05-09 DIAGNOSIS — M5442 Lumbago with sciatica, left side: Secondary | ICD-10-CM

## 2017-05-09 DIAGNOSIS — M545 Low back pain: Secondary | ICD-10-CM | POA: Diagnosis not present

## 2017-05-09 LAB — POCT URINALYSIS DIP (MANUAL ENTRY)
BILIRUBIN UA: NEGATIVE
GLUCOSE UA: NEGATIVE mg/dL
Ketones, POC UA: NEGATIVE mg/dL
Leukocytes, UA: NEGATIVE
Nitrite, UA: NEGATIVE
PH UA: 7 (ref 5.0–8.0)
PROTEIN UA: NEGATIVE mg/dL
RBC UA: NEGATIVE
Spec Grav, UA: 1.025 (ref 1.010–1.025)
UROBILINOGEN UA: 0.2 U/dL

## 2017-05-09 MED ORDER — MELOXICAM 7.5 MG PO TABS: 7.5000 mg | ORAL_TABLET | Freq: Every day | ORAL | 0 refills | Status: DC

## 2017-05-09 MED FILL — DEXTROAMP-AMPHETAMIN 20 MG: 20 | 30 days supply | Qty: 45 | Fill #0

## 2017-05-09 MED FILL — MELOXICAM 7.5 MG TABLET: 7.5 | 30 days supply | Qty: 30 | Fill #0

## 2017-05-09 NOTE — Progress Notes (Signed)
Subjective:  By signing my name below, I, Stann Ore, attest that this documentation has been prepared under the direction and in the presence of Meredith Staggers, MD. Electronically Signed: Stann Ore, Scribe. 05/09/2017 , 2:14 PM .  Patient was seen in Room 11 .   Patient ID: Dominic Gibson, male    DOB: 08/30/1985, 32 y.o.   MRN: [id number] Chief Complaint  Patient presents with  . Establish Care    back pain (1 year) and groin pain (2 weeks off and on).    HPI Dominic Gibson is a 32 y.o. male Here to establish care and complains of back pain and groin pain.   Back pain He's been treated for bilateral low back pain with outpatient rehab last year, and evaluated by Dr. Pearletha Forge at [facility] in June 2018 initially. Per that note, he had been treated through chiropractor, Advil, occasional flexeril and back brace. In Aug 2018, his symptoms had improved; however, in Sept 2018, pain was recurring even with home exercises. He tried lidocaine patches and TENS unit without relief. At that time, referred to physical therapy. 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   Appears he also had arm and face tingling, and seen by neurologist, Dr. Karel Jarvis, in Nov 2018. MRI brain and cervical spine were ordered, which were reportedly both normal without tumor, stroke or MS changes, option of nerve testing and EMG discussed.   Patient states he strained his back while working, and went to chiropractor and physical therapy. He reports still having intermittent back pain, with left sided pain. He denies any issues over his right side. He also notes having left leg numbness going into his left calf, which had improved with orthotics. He noticed his gait wasn't as steady with his back pain. His last physical therapy was at the end of last year. He's been taking half tablet of flexeril every few weeks, and lately Aleve 525mg  QD in the  morning before work. He's also tried to sit with a better posture. His last prednisone taper for back pain was in Dec 2018. He denies saddle anesthesia, urinary or bowel incontinence. He notes having some anxiety with his chronic pain. He also plans to return to regular exercise through swimming.   Urethral pain He was seen by [facility] in July 2017 for pelvic floor dysfunction, by Dr. Retta Diones; thought urinary symptoms due to interstitial cystitis. Patient was started on hydroxyzine, no issues in the morning, but throughout the day, he still has some dysuria and mild urgency with some urethral pain. He had prostate exam with some urge to urinate. He didn't have blood work done at that time without PSA drawn. He was treated for prostatitis on amitriptyline 10mg  QD by a NP at [facility]. He had physical therapy for relaxing his pelvic floor. When he came off of his bland diet, his urethral pain had improved to being intermittent. He plans to schedule appointment to see Dr. Logan Bores at [facility]   He had a testicular exam done recently by a PA with minor shooting pain from right testicle, which occurs about once a week. He denies rectal, or anal pain. He denies urinary frequency. He is sexually active with females, and in a monogamous relationship. He had gonorrhea and chlamydia testing 2 years ago, which was negative.   Patient Active Problem List   Diagnosis Date Noted  . Low back  pain 06/30/2016  . Pelvic pain in male 10/14/2015   Past Medical History:  Diagnosis Date  . Allergy    Past Surgical History:  Procedure Laterality Date  . COSMETIC SURGERY     Allergies  Allergen Reactions  . Shellfish-Derived Products Other (See Comments)  . Sulfa Antibiotics Rash   Prior to Admission medications   Medication Sig Start Date End Date Taking? Authorizing Provider  alfuzosin (UROXATRAL) 10 MG 24 hr tablet  06/01/16   [provider]  amitriptyline (ELAVIL) 10 MG  tablet  06/08/16   [provider]  amphetamine-dextroamphetamine (ADDERALL) 20 MG tablet  06/18/16   [provider]  azithromycin (ZITHROMAX) 250 MG tablet Take 1 tablet (250 mg total) by mouth daily. Take first 2 tablets together, then 1 every day until finished. Please fill on 12/21 if symptoms not improving 12/24/16   Cathie Hoops, Amy V, PA-C  benzonatate (TESSALON) 100 MG capsule Take 1 capsule (100 mg total) by mouth every 8 (eight) hours. 12/19/16   Cathie Hoops, Amy V, PA-C  cetirizine-pseudoephedrine (ZYRTEC-D) 5-120 MG tablet Take 1 tablet by mouth daily. 12/19/16   Belinda Fisher, PA-C  cyclobenzaprine (FLEXERIL) 10 MG tablet  06/18/16   [provider]  EPINEPHrine (EPIPEN 2-PAK) 0.3 mg/0.3 mL IJ SOAJ injection  04/21/15   [provider]  erythromycin [facility]) ophthalmic ointment Place 1 application into both eyes 4 (four) times daily. 12/21/16   Eulis Foster, FNP  fluticasone (FLONASE) 50 MCG/ACT nasal spray Place 2 sprays into both nostrils daily. 12/19/16   Belinda Fisher, PA-C  hydrOXYzine (ATARAX/VISTARIL) 25 MG tablet  06/01/16   [provider]  trimethoprim-polymyxin b (POLYTRIM) ophthalmic solution Place 1 drop into both eyes every 4 (four) hours. For 5 days 01/07/17   Beau Fanny, FNP   Social History   Socioeconomic History  . Marital status: Single    Spouse name: Not on file  . Number of children: Not on file  . Years of education: Not on file  . Highest education level: Not on file  Occupational History  . Not on file  Social Needs  . Financial resource strain: Not on file  . Food insecurity:    Worry: Not on file    Inability: Not on file  . Transportation needs:    Medical: Not on file    Non-medical: Not on file  Tobacco Use  . Smoking status: Former Games developer  . Smokeless tobacco: Never Used  Substance and Sexual Activity  . Alcohol use: Yes  . Drug use: No  . Sexual activity: Not on file  Lifestyle  . Physical activity:    Days per  week: Not on file    Minutes per session: Not on file  . Stress: Not on file  Relationships  . Social connections:    Talks on phone: Not on file    Gets together: Not on file    Attends religious service: Not on file    Active member of club or organization: Not on file    Attends meetings of clubs or organizations: Not on file    Relationship status: Not on file  . Intimate partner violence:    Fear of current or ex partner: Not on file    Emotionally abused: Not on file    Physically abused: Not on file    Forced sexual activity: Not on file  Other Topics Concern  . 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  Eyes: Negative for visual disturbance.  Respiratory: Negative for cough, chest tightness and shortness of breath.   Cardiovascular: Negative for chest pain, palpitations and leg swelling.  Gastrointestinal: Negative for abdominal pain and blood in stool.  Genitourinary: Positive for dysuria and urgency.  Musculoskeletal: Positive for back pain.  Neurological: Negative for dizziness, light-headedness and headaches.  Psychiatric/Behavioral: The patient is nervous/anxious.        Objective:   Physical Exam  Constitutional: He is oriented to person, place, and time. He appears well-developed and well-nourished. No distress.  HENT:  Head: Normocephalic and atraumatic.  Eyes: Pupils are equal, round, and reactive to light. EOM are normal.  Neck: Neck supple.  Cardiovascular: Normal rate.  Pulmonary/Chest: Effort normal. No respiratory distress.  Genitourinary:  Genitourinary Comments: no nodules or bumps, no cysts, no eternal rash, including epididymis  Musculoskeletal: Normal range of motion.  Negative seated straight leg raise bilaterally,  no mid line tenderness, describes pain over left lower paraspinals; lumbar spine flexion 70-80 degrees, lateral flexion intact, good rotation, able to heel-toe walk without difficulty  Neurological: He is alert and oriented to person, place, and time. He displays no Babinski's sign on the right side. He displays no Babinski's sign on the left side.  Reflex Scores:      Patellar reflexes are 2+ on the right side and 2+ on the left side.      Achilles reflexes are 2+ on the right side and 2+ on the left side. Skin: Skin is warm and dry.  Psychiatric: He has a normal mood and affect. His behavior is normal.  Nursing note and vitals reviewed.   Vitals:   05/09/17 1333  BP: 110/70  Pulse: 99  Temp: 98.1 F (36.7 C)  TempSrc: Oral  SpO2: 97%  Weight: 182 lb 6.4 oz (82.7 kg)  Height: 6\' 3"  (1.905 m)       Assessment & Plan:   Dominic Gibson is a 32 y.o. male Chronic left-sided low back pain with left-sided sciatica - Plan: meloxicam (MOBIC) 7.5 MG tablet, DG Lumbar Spine Complete  -Chronic recurrent low back pain, no red flags on exam or history at present time.  Will try meloxicam 7.5 mg daily for now, repeat lumbar spine x-ray, Flexeril if needed and recheck in 3 to 4 weeks.  Low intensity exercise with swimming okay as long as it does not worsen his symptoms.  Return sooner if worse.  Urethritis - Plan: GC/Chlamydia Probe Amp, HIV antibody, Trichomonas vaginalis, RNA, POCT urinalysis dipstick, PSA Rroutine screening for STI (sexually transmitted infection)  -screen for STIs and PSA for possible urethritis versus prostatitis but less likely.  Continue plan follow-up with urologist as differential includes interstitial cystitis.  Inguinal pain, unspecified laterality - Plan: PSA Deep groin pain - Plan: PSA  -As above, continue Elavil, RTC precautions  if acute worsening.     Meds ordered this encounter  Medications  . meloxicam (MOBIC) 7.5 MG tablet    Sig: Take 1 tablet (7.5 mg  total) by mouth daily.    Dispense:  30 tablet    Refill:  0   Patient Instructions   For urinary symptoms, I would consider following up with urologist Dr. Logan Bores.  Ok to continue Elavil at this time. I will check other potential causes of infections, but less likely. Return to the clinic or go to the nearest emergency room if any of your symptoms worsen or new symptoms occur.  For your back pain, I did review the prior MRI results and notes from Dr. Pearletha Forge. We can try mobic for now (do not combine with other NSAIDS), flexeril if needed. Ok to try swimming - low intensity.  Recheck in 3-4 weeks. Sooner if worse.   Thanks for coming in today.    IF you received an x-ray today, you will receive an invoice from [facility]. Please contact [facility] at [phone] with questions or concerns regarding your invoice.   IF you received labwork today, you will receive an invoice from [place]. Please contact [facility] at [phone] with questions or concerns regarding your invoice.   Our billing staff will not be able to assist you with questions regarding bills from these companies.  You will be contacted with the lab results as soon as they are available. The fastest way to get your results is to activate your My Chart account. Instructions are located on the last page of this paperwork. If you have not heard from [place] regarding the results in 2 weeks, please contact this office.       I personally performed the services described in this documentation, which was scribed in my presence. The recorded information has been reviewed and considered for accuracy and completeness, addended by me as needed, and agree with information above.  Signed,   Meredith Staggers, MD Primary Care at [facility].  05/11/17 10:21 PM

## 2017-05-09 NOTE — Patient Instructions (Addendum)
For urinary symptoms, I would consider following up with urologist Dr. Logan Bores. Ok to continue Elavil at this time. I will check other potential causes of infections, but less likely. Return to the clinic or go to the nearest emergency room if any of your symptoms worsen or new symptoms occur.  For your back pain, I did review the prior MRI results and notes from Dr. Pearletha Forge. We can try mobic for now (do not combine with other NSAIDS), flexeril if needed. Ok to try swimming - low intensity.  Recheck in 3-4 weeks. Sooner if worse.   Thanks for coming in today.    IF you received an x-ray today, you will receive an invoice from [facility]. Please contact [facility] at [phone] with questions or concerns regarding your invoice.   IF you received labwork today, you will receive an invoice from [facility]. Please contact [facility] at [phone] with questions or concerns regarding your invoice.   Our billing staff will not be able to assist you with questions regarding bills from these companies.  You will be contacted with the lab results as soon as they are available. The fastest way to get your results is to activate your My Chart account. Instructions are located on the last page of this paperwork. If you have not heard from [place] regarding the results in 2 weeks, please contact this office.

## 2017-05-10 LAB — GC/CHLAMYDIA PROBE AMP
Chlamydia trachomatis, NAA: NEGATIVE
Neisseria gonorrhoeae by PCR: NEGATIVE

## 2017-05-10 LAB — HIV ANTIBODY (ROUTINE TESTING W REFLEX): HIV SCREEN 4TH GENERATION: NONREACTIVE

## 2017-05-10 LAB — PSA: Prostate Specific Ag, Serum: 0.5 ng/mL (ref 0.0–4.0)

## 2017-05-10 LAB — TRICHOMONAS VAGINALIS, PROBE AMP: TRICH VAG BY NAA: NEGATIVE

## 2017-05-10 MED FILL — hydrOXYzine HCL 25 MG TABS: 25 | 30 days supply | Qty: 90 | Fill #0

## 2017-05-11 ENCOUNTER — Encounter: Payer: Self-pay | Admitting: Family Medicine

## 2017-06-07 ENCOUNTER — Encounter: Payer: Self-pay | Admitting: Family Medicine

## 2017-06-07 ENCOUNTER — Other Ambulatory Visit: Payer: Self-pay

## 2017-06-07 ENCOUNTER — Ambulatory Visit (INDEPENDENT_AMBULATORY_CARE_PROVIDER_SITE_OTHER): Payer: 59 | Admitting: Family Medicine

## 2017-06-07 VITALS — HR 87 | Temp 97.4°F | Ht 75.0 in | Wt 184.4 lb

## 2017-06-07 DIAGNOSIS — M5442 Lumbago with sciatica, left side: Secondary | ICD-10-CM

## 2017-06-07 DIAGNOSIS — M549 Dorsalgia, unspecified: Secondary | ICD-10-CM

## 2017-06-07 DIAGNOSIS — M6289 Other specified disorders of muscle: Secondary | ICD-10-CM | POA: Diagnosis not present

## 2017-06-07 MED ORDER — CYCLOBENZAPRINE HCL 5 MG PO TABS: 5.0000 mg | ORAL_TABLET | Freq: Every evening | ORAL | 1 refills | Status: AC | PRN

## 2017-06-07 MED FILL — CYCLOBENZAPRINE 5 MG TABLET: 5 | 15 days supply | Qty: 15 | Fill #0

## 2017-06-07 NOTE — Progress Notes (Signed)
Subjective:  By signing my name below, I, Dominic Gibson, attest that this documentation has been prepared under the direction and in the presence of Dominic Ray, MD. Electronically Signed: [name], [place]. 06/07/2017 , 2:12 PM .  Patient was seen in Room 1 .   Patient ID: Dominic Gibson, male    DOB: 07-24-1985, 32 y.o.   MRN: [id number] Chief Complaint  Patient presents with  . Annual Exam    CPE    HPI Dominic Gibson is a 32 y.o. male  Here for follow up. I saw him most recently on May 6th for back pain and urethritis symptoms. Recommended continue follow up with urologist for interstitial cystitis. He takes Meloxicam 7.5 mg for back pain and flexeril as needed. Option of low intensity exercise, like swimming. Low lumbar spine MRI sept 2018, disc protrusion L5-S1, contacting S1 nerve root on the right. He was initially treated with physical therapy, chiropractor, back brace, lidocaine patches, TENS unit, and prednisone taper. He was reporting more left sided pain last visit. Lumbar spine xray at last visit, showed mild disc narrowing L4-5, stable since Sept 2018.   Patient states he stopped taking Meloxicam after 2 weeks because he didn't feel any relief while on it. He went back to taking Naproxen 440 mg qd. He reports his back pain is even worse, with pain going up into left side neck. He describes pain on both sides of his upper back. His last physical therapy was in Oct-Nov 2018. He denies unexplained weight loss or night sweats. He states taking Flexeril quarter to half tablet helps pain next day. He hasn't met with a back specialist. He denies feeling more stressed out recently, or doesn't stress out easily. He does do deep meditating for stress relieve. He's being seen through [employer] workers comp. He was also recently switched to level 4 patients, so less lifting patients and his back pain has improved. He also mentioned right thigh tightness while at work; recently switched  shoes.   He mentions taking amitriptyline and also takes hydroxyzine for pelvic pain. He also takes benadryl to help with sleep, once every 2 weeks Ambien half tablet to restart his sleep cycle. His PCP is Dr. Laurann Montana.   Patient Active Problem List   Diagnosis Date Noted  . Low back pain 06/30/2016  . Pelvic pain in male 10/14/2015   Past Medical History:  Diagnosis Date  . Allergy    Past Surgical History:  Procedure Laterality Date  . COSMETIC SURGERY     Allergies  Allergen Reactions  . Sulfa Antibiotics Rash   Prior to Admission medications   Medication Sig Start Date End Date Taking? Authorizing Provider  amitriptyline (ELAVIL) 10 MG tablet  06/08/16   [provider]  amphetamine-dextroamphetamine (ADDERALL) 20 MG tablet  06/18/16   [provider]  cyclobenzaprine (FLEXERIL) 10 MG tablet  06/18/16   [provider]  EPINEPHrine (EPIPEN 2-PAK) 0.3 mg/0.3 mL IJ SOAJ injection  04/21/15   [provider]  meloxicam (MOBIC) 7.5 MG tablet Take 1 tablet (7.5 mg total) by mouth daily. 05/09/17   Wendie Agreste, MD   Social History   Socioeconomic History  . Marital status: Single    Spouse name: Not on file  . Number of children: Not on file  . Years of education: Not on file  . Highest education level: Not on file  Occupational History  . Not on file  Social Needs  . Financial  resource strain: Not on file  . Food insecurity:    Worry: Not on file    Inability: Not on file  . Transportation needs:    Medical: Not on file    Non-medical: Not on file  Tobacco Use  . Smoking status: Former Research scientist (life sciences)  . Smokeless tobacco: Never Used  Substance and Sexual Activity  . Alcohol use: Yes  . Drug use: No  . Sexual activity: Not on file  Lifestyle  . Physical activity:    Days per week: Not on file    Minutes per session: Not on file  . Stress: Not on file  Relationships  . Social connections:    Talks on phone: Not on file    Gets  together: Not on file    Attends religious service: Not on file    Active member of club or organization: Not on file    Attends meetings of clubs or organizations: Not on file    Relationship status: Not on file  . Intimate partner violence:    Fear of current or ex partner: Not on file    Emotionally abused: Not on file    Physically abused: Not on file    Forced sexual activity: Not on file  Other Topics Concern  . 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  Eyes: Negative for visual disturbance.  Respiratory: Negative for cough, chest tightness and shortness of breath.   Cardiovascular: Negative for chest pain, palpitations and leg swelling.  Gastrointestinal: Negative for abdominal pain and Gibson in stool.  Musculoskeletal: Positive for back pain and myalgias. Negative for arthralgias.  Neurological: Negative for dizziness, light-headedness and headaches.       Objective:   Physical Exam  Constitutional: He is oriented to person, place, and time. He appears well-developed and well-nourished. No distress.  HENT:  Head: Normocephalic and atraumatic.  Eyes: Pupils are equal, round, and reactive to light. EOM are normal.  Neck: Neck supple.  Cardiovascular: Normal rate.  Pulmonary/Chest: Effort normal. No respiratory distress.  Musculoskeletal: Normal range of motion.  Minimal tenderness left lower lumbar paraspinals, no upper back tenderness, C-spine minimal decreased extension, left rotation, and right rotation; no focal bony tenderness  Neurological: He is alert and oriented to person, place, and time.  Skin: Skin is warm and dry.  Psychiatric: He has a normal mood and affect. His behavior is normal.  Nursing note and vitals reviewed.   Vitals:    06/07/17 1336  Pulse: 87  Temp: (!) 97.4 F (36.3 C)  TempSrc: Oral  SpO2: 99%  Weight: 184 lb 6.4 oz (83.6 kg)  Height: 6' 3"  (1.905 m)       Assessment & Plan:   BRENDA COWHER is a 32 y.o. male Left-sided low back pain with left-sided sciatica, unspecified chronicity - Plan: cyclobenzaprine (FLEXERIL) 5 MG tablet Pain, upper back - Plan: cyclobenzaprine (FLEXERIL) 5 MG tablet  -Recurrent upper and lower back pain.  Did have some low back pain in the past with L5-S1 disc protrusion to the right on MRI in September 2018.  Did recommend an evaluation with back specialist/orthopedics.  Apparently that is in process through a separate injury from [employer]  If he is not referred to Ortho, he will let me know and I can certainly refer him to orthopedic with possible physical therapy.  -Okay to continue Naprosyn episodically for now, Flexeril if needed at bedtime.  Cautioned against combination with other sedating medications but denies problems with his other medications previously.  Quadricep tightness  -Demonstrated quadricep stretch in office but discussed other stretching throughout the day as initial approach.  May also work on next through physical therapy for his back.  Meds ordered this encounter  Medications  . cyclobenzaprine (FLEXERIL) 5 MG tablet    Sig: Take 1 tablet (5 mg total) by mouth at bedtime as needed for muscle spasms (start qhs prn due to sedation).    Dispense:  15 tablet    Refill:  1   Patient Instructions    Ok to continue naprosyn for now, occasional low dose flexeril if needed. I would recommend meeting with back specialist to determine next step, but suspect physical therapy may be helpful.  Since you are being treated for the back through workers comp, you can see if orthopaedic referral is in process. If not, let me know and I can refer you.   I will refill low dose flexeril for now but would be careful combining with other sedating meds.   I  agree with continuing quadricep stretches throughout the day, especially at work as it seems that his when it becomes tight.  If that persists, it could be related to the previous disc issue seen on your MRI last year.  That can also be followed up with orthopedics if persistent symptoms.  Thanks for coming in today.  IF you received an x-Gibson today, you will receive an invoice from [facility]. Please contact [facility] at [phone] with questions or concerns regarding your invoice.   IF you received labwork today, you will receive an invoice from [place]. Please contact [facility] at [phone] with questions or concerns regarding your invoice.   Our billing staff will not be able to assist you with questions regarding bills from these companies.  You will be contacted with the lab results as soon as they are available. The fastest way to get your results is to activate your My Chart account. Instructions are located on the last page of this paperwork. If you have not heard from [place] regarding the results in 2 weeks, please contact this office.      I personally performed the services described in this documentation, which was scribed in my presence. The recorded information has been reviewed and considered for accuracy and completeness, addended by me as needed, and agree with information above.  Signed,   Dominic Ray, MD Primary Care at [place].  06/10/17 9:41 AM

## 2017-06-07 NOTE — Patient Instructions (Addendum)
  Ok to continue naprosyn for now, occasional low dose flexeril if needed. I would recommend meeting with back specialist to determine next step, but suspect physical therapy may be helpful.  Since you are being treated for the back through workers comp, you can see if orthopaedic referral is in process. If not, let me know and I can refer you.   I will refill low dose flexeril for now but would be careful combining with other sedating meds.   I agree with continuing quadricep stretches throughout the day, especially at work as it seems that his when it becomes tight.  If that persists, it could be related to the previous disc issue seen on your MRI last year.  That can also be followed up with orthopedics if persistent symptoms.  Thanks for coming in today.  IF you received an x-ray today, you will receive an invoice from [facility]. Please contact [facility] at [phone] with questions or concerns regarding your invoice.   IF you received labwork today, you will receive an invoice from [place]. Please contact [facility] at [phone] with questions or concerns regarding your invoice.   Our billing staff will not be able to assist you with questions regarding bills from these companies.  You will be contacted with the lab results as soon as they are available. The fastest way to get your results is to activate your My Chart account. Instructions are located on the last page of this paperwork. If you have not heard from [place] regarding the results in 2 weeks, please contact this office.

## 2017-06-14 ENCOUNTER — Ambulatory Visit (INDEPENDENT_AMBULATORY_CARE_PROVIDER_SITE_OTHER): Payer: 59 | Admitting: Family Medicine

## 2017-06-14 ENCOUNTER — Other Ambulatory Visit: Payer: Self-pay

## 2017-06-14 ENCOUNTER — Encounter: Payer: Self-pay | Admitting: Family Medicine

## 2017-06-14 VITALS — BP 100/70 | HR 81 | Temp 97.9°F | Ht 75.0 in | Wt 185.4 lb

## 2017-06-14 DIAGNOSIS — Z0000 Encounter for general adult medical examination without abnormal findings: Secondary | ICD-10-CM | POA: Diagnosis not present

## 2017-06-14 DIAGNOSIS — Z131 Encounter for screening for diabetes mellitus: Secondary | ICD-10-CM

## 2017-06-14 DIAGNOSIS — Z23 Encounter for immunization: Secondary | ICD-10-CM

## 2017-06-14 DIAGNOSIS — Z1329 Encounter for screening for other suspected endocrine disorder: Secondary | ICD-10-CM | POA: Diagnosis not present

## 2017-06-14 NOTE — Progress Notes (Signed)
Subjective:  By signing my name below, I, Dominic Gibson, attest that this documentation has been prepared under the direction and in the presence of Dominic Ray, MD. Electronically Signed: [name], [place]. 06/14/2017 , 2:34 PM .  Patient was seen in Room 9 .   Patient ID: Dominic Gibson, male    DOB: 12/05/1985, 32 y.o.   MRN: [id number] Chief Complaint  Patient presents with  . Annual Exam    CPE   HPI Dominic Gibson is a 32 y.o. male Here for annual physical. See previous notes regarding back pain and other symptoms.   Urology He's followed by urologist for urethritis and inguinal pain. He takes elavil and hydroxyzine as needed.   Intermittent insomnia He takes Ambien to adjust sleep cycle in changing work shifts, with occasional benadryl every few weeks as well. His PCP is Dominic Pillar, MD.   Since last visit, he's been doing a light detox and less dependent on benadryl since. He's been reading to help calm him down at night.   He takes Adderall 15 mg in the morning, and 10 before lunch. He used to take XR in the past but couldn't sleep well in the evening.   Family history He has family history of hypothyroidism with mother and older brother, taking synthroid. His father is on HCTZ for HTN. No known family history of hyperlipidemia or diabetes.   Immunizations Immunization History  Administered Date(s) Administered  . DTaP 09/13/1986, 11/14/1986, 01/24/1987, 11/20/1987, 07/17/1991  . Hepatitis A 03/13/2009  . Hepatitis B 10/15/1997, 11/19/1997, 04/15/1998  . IPV 09/13/1986, 11/14/1986, 11/20/1987, 07/17/1991  . Influenza Split 10/05/2011  . MMR 11/20/1987, 07/17/1991  . PPD Test 08/10/2013, 12/03/2013  . Td 08/18/2004, 03/13/2009  . Tdap 03/13/2009  . Yellow Fever 03/13/2009   HPV vaccine: received it today. He denies much reactions with vaccines in the past; occasional fever, but able to knock it out with tylenol or Advil.  Meningitis vaccine:  recommended as he works in the [place].   Depression Depression screen [facility] 2/9 06/14/2017 06/07/2017 05/09/2017  Decreased Interest 0 0 0  Down, Depressed, Hopeless 0 0 0  PHQ - 2 Score 0 0 0    Vision  Visual Acuity Screening   Right eye Left eye Both eyes  Without correction:     With correction: _0   Last seen eye doctor for regular check up in 2016/2017. But last seen optometrist in Dec 2018 for recurring conjunctiva due to allergies to his girlfriend's cat. He takes hydroxyzine for relief as well.   Dentist He's followed by [facility], every 6 months. His dental health improved after drinking more water, and moving away from sodas and juices.   Exercise He's swimming about 3 times a week for about 45 minutes to an hour. PT stretches a few times a day with around 30-45 minutes including planking and arching his back. He denies any issues with his quads now after stretching.   STI screening Performed on May 6th, all reassuring.   Patient Active Problem List   Diagnosis Date Noted  . Low back pain 06/30/2016  . Pelvic pain in male 10/14/2015   Past Medical History:  Diagnosis Date  . Allergy    Past Surgical History:  Procedure Laterality Date  . COSMETIC SURGERY     Allergies  Allergen Reactions  . Sulfa Antibiotics Rash   Prior to Admission medications   Medication Sig Start Date End Date Taking? Authorizing Provider  amitriptyline (ELAVIL) 10 MG tablet  06/08/16   [provider]  amphetamine-dextroamphetamine (ADDERALL) 20 MG tablet  06/18/16   [provider]  cyclobenzaprine (FLEXERIL) 5 MG tablet Take 1 tablet (5 mg total) by mouth at bedtime as needed for muscle spasms (start qhs prn due to sedation). 06/07/17   Wendie Agreste, MD  EPINEPHrine (EPIPEN 2-PAK) 0.3 mg/0.3 mL IJ SOAJ injection  04/21/15   [provider]  hydrOXYzine (ATARAX/VISTARIL) 25 MG tablet Take 25 mg by mouth every 8 (eight) hours as needed. 05/10/17    [provider]  zolpidem (AMBIEN) 10 MG tablet Take 10 mg by mouth at bedtime as needed. for sleep 03/03/17   [provider]   Social History   Socioeconomic History  . Marital status: Single    Spouse name: Not on file  . Number of children: Not on file  . Years of education: Not on file  . Highest education level: Not on file  Occupational History  . Not on file  Social Needs  . Financial resource strain: Not on file  . Food insecurity:    Worry: Not on file    Inability: Not on file  . Transportation needs:    Medical: Not on file    Non-medical: Not on file  Tobacco Use  . Smoking status: Former Research scientist (life sciences)  . Smokeless tobacco: Never Used  Substance and Sexual Activity  . Alcohol use: Yes  . Drug use: No  . Sexual activity: Not on file  Lifestyle  . Physical activity:    Days per week: Not on file    Minutes per session: Not on file  . Stress: Not on file  Relationships  . Social connections:    Talks on phone: Not on file    Gets together: Not on file    Attends religious service: Not on file    Active member of club or organization: Not on file    Attends meetings of clubs or organizations: Not on file    Relationship status: Not on file  . Intimate partner violence:    Fear of current or ex partner: Not on file    Emotionally abused: Not on file    Physically abused: Not on file    Forced sexual activity: Not on file  Other Topics Concern  . Not on file  Social History Narrative   Pt lives alone in 1 story home   Does not have any children at this time   Highest level of education: RN, BSN   Works as Therapist, sports in [employer]   Review of Systems 13 point ROS - negative     Objective:   Physical Exam  Constitutional: He is oriented to person, place, and time. He appears well-developed and well-nourished.  HENT:  Head: Normocephalic and atraumatic.  Right Ear: External ear normal.  Left Ear: External ear normal.  Mouth/Throat: Oropharynx  is clear and moist.  Eyes: Pupils are equal, round, and reactive to light. Conjunctivae and EOM are normal.  Neck: Normal range of motion. Neck supple. No thyromegaly present.  Cardiovascular: Normal rate, regular rhythm, normal heart sounds and intact distal pulses.  Pulmonary/Chest: Effort normal and breath sounds normal. No respiratory distress. He has no wheezes.  Abdominal: Soft. He exhibits no distension. There is no tenderness.  Musculoskeletal: Normal range of motion. He exhibits no edema or tenderness.  Lymphadenopathy:    He has no cervical adenopathy.  Neurological: He is alert and oriented  to person, place, and time. He has normal reflexes.  Skin: Skin is warm and dry.  Psychiatric: He has a normal mood and affect. His behavior is normal.  Vitals reviewed.    Vitals:   06/14/17 1357  BP: 100/70  Pulse: 81  Temp: 97.9 F (36.6 C)  TempSrc: Oral  SpO2: 98%  Weight: 185 lb 6.4 oz (84.1 kg)  Height: _0  (1.905 m)       Assessment & Plan:  ION GONNELLA is a 32 y.o. male Annual physical exam  - -anticipatory guidance as below in AVS, screening labs above. Health maintenance items as above in HPI discussed/recommended as applicable.   Need for HPV vaccination - Plan: HPV 9-valent vaccine,Recombinat  -Initial vaccine given, discussed follow-up doses  Screening for thyroid disorder - Plan: TSH  -Family history of thyroid disease, denies symptoms but will screen TSH.  Screening for diabetes mellitus - Plan: Comprehensive metabolic panel   No orders of the defined types were placed in this encounter.  Patient Instructions   Keep follow up with primary care provider as scheduled.   I checked CMP and TSH today. I will let you know the results in the next 2 weeks.   HPV vaccine started today. You can check into meningitis vaccine if you would like.  We do have that here if needed.   Call your eye care provider for follow up appointment.   Thanks for coming in  today.   Keeping you healthy  Get these tests  Gibson pressure- Have your Gibson pressure checked once a year by your healthcare provider.  Normal Gibson pressure is 120/80.  Weight- Have your body mass index (BMI) calculated to screen for obesity.  BMI is a measure of body fat based on height and weight. You can also calculate your own BMI at [employer].  Cholesterol- Have your cholesterol checked regularly starting at age 70, sooner may be necessary if you have diabetes, high Gibson pressure, if a family member developed heart diseases at an early age or if you smoke.   Chlamydia, HIV, and other sexual transmitted disease- Get screened each year until the age of 78 then within three months of each new sexual partner.  Diabetes- Have your Gibson sugar checked regularly if you have high Gibson pressure, high cholesterol, a family history of diabetes or if you are overweight.  Get these vaccines  Flu shot- Every fall.  Tetanus shot- Every 10 years.  Menactra- Single dose; prevents meningitis.  Take these steps  Don't smoke- If you do smoke, ask your healthcare provider about quitting. For tips on how to quit, go to www.smokefree.gov or call 1-800-QUIT-NOW.  Be physically active- Exercise 5 days a week for at least 30 minutes.  If you are not already physically active start slow and gradually work up to 30 minutes of moderate physical activity.  Examples of moderate activity include walking briskly, mowing the yard, dancing, swimming bicycling, etc.  Eat a healthy diet- Eat a variety of healthy foods such as fruits, vegetables, low fat milk, low fat cheese, yogurt, lean meats, poultry, fish, beans, tofu, etc.  For more information on healthy eating, go to www.thenutritionsource.org  Drink alcohol in moderation- Limit alcohol intake two drinks or less a day.  Never drink and drive.  Dentist- Brush and floss teeth twice daily; visit your dentis twice a year.  Depression-Your  emotional health is as important as your physical health.  If you're feeling down, losing interest in things  you normally enjoy please talk with your healthcare provider.  Gun Safety- If you keep a gun in your home, keep it unloaded and with the safety lock on.  Bullets should be stored separately.  Helmet use- Always wear a helmet when riding a motorcycle, bicycle, rollerblading or skateboarding.  Safe sex- If you may be exposed to a sexually transmitted infection, use a condom  Seat belts- Seat bels can save your life; always wear one.  Smoke/Carbon Monoxide detectors- These detectors need to be installed on the appropriate level of your home.  Replace batteries at least once a year.  Skin Cancer- When out in the sun, cover up and use sunscreen SPF 15 or higher.  Violence- If anyone is threatening or hurting you, please tell your healthcare provider.      IF you received an x-Gibson today, you will receive an invoice from [facility]. Please contact [facility] at [phone] with questions or concerns regarding your invoice.   IF you received labwork today, you will receive an invoice from [place]. Please contact [facility] at [phone] with questions or concerns regarding your invoice.   Our billing staff will not be able to assist you with questions regarding bills from these companies.  You will be contacted with the lab results as soon as they are available. The fastest way to get your results is to activate your My Chart account. Instructions are located on the last page of this paperwork. If you have not heard from [place] regarding the results in 2 weeks, please contact this office.       I personally performed the services described in this documentation, which was scribed in my presence. The recorded information has been reviewed and considered for accuracy and completeness, addended by me as needed, and agree with information above.  Signed,   Dominic Ray, MD [facility].  06/18/17 11:02 PM

## 2017-06-14 NOTE — Patient Instructions (Addendum)
Keep follow up with primary care provider as scheduled.   I checked CMP and TSH today. I will let you know the results in the next 2 weeks.   HPV vaccine started today. You can check into meningitis vaccine if you would like.  We do have that here if needed.   Call your eye care provider for follow up appointment.   Thanks for coming in today.   Keeping you healthy  Get these tests  Blood pressure- Have your blood pressure checked once a year by your healthcare provider.  Normal blood pressure is 120/80.  Weight- Have your body mass index (BMI) calculated to screen for obesity.  BMI is a measure of body fat based on height and weight. You can also calculate your own BMI at https://www.west-esparza.com/.  Cholesterol- Have your cholesterol checked regularly starting at age 94, sooner may be necessary if you have diabetes, high blood pressure, if a family member developed heart diseases at an early age or if you smoke.   Chlamydia, HIV, and other sexual transmitted disease- Get screened each year until the age of 82 then within three months of each new sexual partner.  Diabetes- Have your blood sugar checked regularly if you have high blood pressure, high cholesterol, a family history of diabetes or if you are overweight.  Get these vaccines  Flu shot- Every fall.  Tetanus shot- Every 10 years.  Menactra- Single dose; prevents meningitis.  Take these steps  Don't smoke- If you do smoke, ask your healthcare provider about quitting. For tips on how to quit, go to www.smokefree.gov or call 1-800-QUIT-NOW.  Be physically active- Exercise 5 days a week for at least 30 minutes.  If you are not already physically active start slow and gradually work up to 30 minutes of moderate physical activity.  Examples of moderate activity include walking briskly, mowing the yard, dancing, swimming bicycling, etc.  Eat a healthy diet- Eat a variety of healthy foods such as fruits, vegetables, low fat  milk, low fat cheese, yogurt, lean meats, poultry, fish, beans, tofu, etc.  For more information on healthy eating, go to www.thenutritionsource.org  Drink alcohol in moderation- Limit alcohol intake two drinks or less a day.  Never drink and drive.  Dentist- Brush and floss teeth twice daily; visit your dentis twice a year.  Depression-Your emotional health is as important as your physical health.  If you're feeling down, losing interest in things you normally enjoy please talk with your healthcare provider.  Gun Safety- If you keep a gun in your home, keep it unloaded and with the safety lock on.  Bullets should be stored separately.  Helmet use- Always wear a helmet when riding a motorcycle, bicycle, rollerblading or skateboarding.  Safe sex- If you may be exposed to a sexually transmitted infection, use a condom  Seat belts- Seat bels can save your life; always wear one.  Smoke/Carbon Monoxide detectors- These detectors need to be installed on the appropriate level of your home.  Replace batteries at least once a year.  Skin Cancer- When out in the sun, cover up and use sunscreen SPF 15 or higher.  Violence- If anyone is threatening or hurting you, please tell your healthcare provider.      IF you received an x-ray today, you will receive an invoice from [facility]. Please contact [facility] at [phone] with questions or concerns regarding your invoice.   IF you received labwork today, you will receive an invoice from [place]. Please  contact [facility] at [phone] with questions or concerns regarding your invoice.   Our billing staff will not be able to assist you with questions regarding bills from these companies.  You will be contacted with the lab results as soon as they are available. The fastest way to get your results is to activate your My Chart account. Instructions are located on the last page of this paperwork. If you have not heard from [place]  regarding the results in 2 weeks, please contact this office.

## 2017-06-15 DIAGNOSIS — M549 Dorsalgia, unspecified: Secondary | ICD-10-CM | POA: Diagnosis not present

## 2017-06-15 DIAGNOSIS — G4726 Circadian rhythm sleep disorder, shift work type: Secondary | ICD-10-CM | POA: Diagnosis not present

## 2017-06-15 DIAGNOSIS — N3010 Interstitial cystitis (chronic) without hematuria: Secondary | ICD-10-CM | POA: Diagnosis not present

## 2017-06-15 DIAGNOSIS — M6289 Other specified disorders of muscle: Secondary | ICD-10-CM | POA: Diagnosis not present

## 2017-06-15 DIAGNOSIS — F988 Other specified behavioral and emotional disorders with onset usually occurring in childhood and adolescence: Secondary | ICD-10-CM | POA: Diagnosis not present

## 2017-06-15 LAB — COMPREHENSIVE METABOLIC PANEL
ALK PHOS: 39 IU/L (ref 39–117)
ALT: 17 IU/L (ref 0–44)
AST: 18 IU/L (ref 0–40)
Albumin/Globulin Ratio: 2.1 (ref 1.2–2.2)
Albumin: 5 g/dL (ref 3.5–5.5)
BUN/Creatinine Ratio: 10 (ref 9–20)
BUN: 11 mg/dL (ref 6–20)
Bilirubin Total: 0.7 mg/dL (ref 0.0–1.2)
CO2: 23 mmol/L (ref 20–29)
Calcium: 9.7 mg/dL (ref 8.7–10.2)
Chloride: 100 mmol/L (ref 96–106)
Creatinine, Ser: 1.09 mg/dL (ref 0.76–1.27)
GFR calc Af Amer: 105 mL/min/{1.73_m2} (ref >59)
GFR calc non Af Amer: 91 mL/min/{1.73_m2} (ref >59)
GLUCOSE: 92 mg/dL (ref 65–99)
Globulin, Total: 2.4 g/dL (ref 1.5–4.5)
Potassium: 4.4 mmol/L (ref 3.5–5.2)
Sodium: 139 mmol/L (ref 134–144)
TOTAL PROTEIN: 7.4 g/dL (ref 6.0–8.5)

## 2017-06-15 LAB — TSH: TSH: 0.589 u[IU]/mL (ref 0.450–4.500)

## 2017-06-15 MED FILL — AMPHETAMINE SALTS 20 MG TAB: 20 | 30 days supply | Qty: 45 | Fill #0

## 2017-06-20 ENCOUNTER — Encounter: Payer: Self-pay | Admitting: Family Medicine

## 2017-06-21 DIAGNOSIS — R102 Pelvic and perineal pain: Secondary | ICD-10-CM | POA: Diagnosis not present

## 2017-06-21 DIAGNOSIS — G8929 Other chronic pain: Secondary | ICD-10-CM | POA: Diagnosis not present

## 2017-06-21 DIAGNOSIS — N411 Chronic prostatitis: Secondary | ICD-10-CM | POA: Diagnosis not present

## 2017-06-23 ENCOUNTER — Telehealth: Payer: Self-pay | Admitting: Family Medicine

## 2017-06-23 NOTE — Telephone Encounter (Signed)
Patient had FMLA forms faxed over I am guessing it is for his lower back pain but I do not see that we have taken him out of work or what kind of accommodations he is needing for his job. So I was not sure how to complete these forms. I will place the blank forms in Dr Dominic Gibson box on 06/23/17 please return to the FMLA/Disability box at the 102 checkout desk within 5-7 business days, thank you!

## 2017-06-24 NOTE — Telephone Encounter (Signed)
We may need to clarify if this is for prior low back pain or if he is still under treatment for workers comp issue for back. I can discuss with him further next week if needed.

## 2017-06-27 NOTE — Telephone Encounter (Signed)
MyChart message sent to pt about FMLA forms °

## 2017-06-28 ENCOUNTER — Ambulatory Visit: Payer: 59 | Admitting: Family Medicine

## 2017-07-01 MED FILL — AMITRIPTYLINE HCL 10 MG TAB: 10 | 90 days supply | Qty: 90 | Fill #0

## 2017-07-06 NOTE — Telephone Encounter (Signed)
Patient has not returned any of my calls or mychart message since it has been over the process time for these forms please return them to my box and I will discard them and patient will have to re-file if he needs them in the future.  Thank you

## 2017-07-19 MED FILL — AMPHETAMINE SALTS 20 MG TAB: 20 | 30 days supply | Qty: 45 | Fill #0

## 2017-08-03 DIAGNOSIS — Z0271 Encounter for disability determination: Secondary | ICD-10-CM

## 2017-08-23 DIAGNOSIS — H52223 Regular astigmatism, bilateral: Secondary | ICD-10-CM | POA: Diagnosis not present

## 2017-08-23 DIAGNOSIS — H5213 Myopia, bilateral: Secondary | ICD-10-CM | POA: Diagnosis not present

## 2017-08-24 MED FILL — ZOLPIDEM TARTRATE 10 MG TAB: 10 | 20 days supply | Qty: 20 | Fill #0

## 2017-08-24 MED FILL — DEXTROAMP-AMPHETAMIN 20 MG: 20 | 30 days supply | Qty: 45 | Fill #0

## 2017-09-06 MED FILL — hydrOXYzine HCL 25 MG TABS: 25 | 90 days supply | Qty: 90 | Fill #0

## 2017-09-21 ENCOUNTER — Encounter: Payer: Self-pay | Admitting: Family Medicine

## 2017-09-28 ENCOUNTER — Encounter: Payer: Self-pay | Admitting: Family Medicine

## 2017-09-29 MED FILL — AMPHETAMINE-DEXTROAMPHETAMI: 20 | 30 days supply | Qty: 45 | Fill #0

## 2017-09-29 MED FILL — AMITRIPTYLINE HCL 10 MG TAB: 10 | 90 days supply | Qty: 90 | Fill #0

## 2017-10-10 MED FILL — CYCLOBENZAPRINE 5 MG TABLET: 5 | 15 days supply | Qty: 15 | Fill #1

## 2017-10-26 ENCOUNTER — Ambulatory Visit: Payer: 59 | Admitting: Family Medicine

## 2017-10-26 ENCOUNTER — Encounter: Payer: Self-pay | Admitting: Family Medicine

## 2017-10-26 ENCOUNTER — Other Ambulatory Visit: Payer: Self-pay

## 2017-10-26 VITALS — BP 108/72 | HR 83 | Temp 98.1°F | Ht 75.0 in | Wt 184.8 lb

## 2017-10-26 DIAGNOSIS — M5442 Lumbago with sciatica, left side: Secondary | ICD-10-CM | POA: Diagnosis not present

## 2017-10-26 NOTE — Patient Instructions (Addendum)
I will refer you to Dr. Yevette Edwards.  Okay to use Flexeril as needed for now, continue stretches and range of motion as tolerated.  Let me know if I can help further and thanks for coming in today.    Back Pain, Adult Many adults have back pain from time to time. Common causes of back pain include:  A strained muscle or ligament.  Wear and tear (degeneration) of the spinal disks.  Arthritis.  A hit to the back.  Back pain can be short-lived (acute) or last a long time (chronic). A physical exam, lab tests, and imaging studies may be done to find the cause of your pain. Follow these instructions at home: Managing pain and stiffness  Take over-the-counter and prescription medicines only as told by your health care provider.  If directed, apply heat to the affected area as often as told by your health care provider. Use the heat source that your health care provider recommends, such as a moist heat pack or a heating pad. ? Place a towel between your skin and the heat source. ? Leave the heat on for 20-30 minutes. ? Remove the heat if your skin turns bright red. This is especially important if you are unable to feel pain, heat, or cold. You have a greater risk of getting burned.  If directed, apply ice to the injured area: ? Put ice in a plastic bag. ? Place a towel between your skin and the bag. ? Leave the ice on for 20 minutes, 2-3 times a day for the first 2-3 days. Activity  Do not stay in bed. Resting more than 1-2 days can delay your recovery.  Take short walks on even surfaces as soon as you are able. Try to increase the length of time you walk each day.  Do not sit, drive, or stand in one place for more than 30 minutes at a time. Sitting or standing for long periods of time can put stress on your back.  Use proper lifting techniques. When you bend and lift, use positions that put less stress on your back: ? [place] your knees. ? Keep the load close to your body. ? Avoid  twisting.  Exercise regularly as told by your health care provider. Exercising will help your back heal faster. This also helps prevent back injuries by keeping muscles strong and flexible.  Your health care provider may recommend that you see a physical therapist. This person can help you come up with a safe exercise program. Do any exercises as told by your physical therapist. Lifestyle  Maintain a healthy weight. Extra weight puts stress on your back and makes it difficult to have good posture.  Avoid activities or situations that make you feel anxious or stressed. Learn ways to manage anxiety and stress. One way to manage stress is through exercise. Stress and anxiety increase muscle tension and can make back pain worse. General instructions  Sleep on a firm mattress in a comfortable position. Try lying on your side with your knees slightly bent. If you lie on your back, put a pillow under your knees.  Follow your treatment plan as told by your health care provider. This may include: ? Cognitive or behavioral therapy. ? Acupuncture or massage therapy. ? Meditation or yoga. Contact a health care provider if:  You have pain that is not relieved with rest or medicine.  You have increasing pain going down into your legs or buttocks.  Your pain does not improve in  2 weeks.  You have pain at night.  You lose weight.  You have a fever or chills. Get help right away if:  You develop new bowel or bladder control problems.  You have unusual weakness or numbness in your arms or legs.  You develop nausea or vomiting.  You develop abdominal pain.  You feel faint. Summary  Many adults have back pain from time to time. A physical exam, lab tests, and imaging studies may be done to find the cause of your pain.  Use proper lifting techniques. When you bend and lift, use positions that put less stress on your back.  Take over-the-counter and prescription medicines and apply heat or  ice as directed by your health care provider. This information is not intended to replace advice given to you by your health care provider. Make sure you discuss any questions you have with your health care provider. Document Released: 12/21/2004 Document Revised: 01/26/2016 Document Reviewed: 01/26/2016 Elsevier Interactive Patient Education  [employer].   If you have lab work done today you will be contacted with your lab results within the next 2 weeks.  If you have not heard from [place] then please contact us. The fastest way to get your results is to register for My Chart.   IF you received an x-ray today, you will receive an invoice from [facility]. Please contact [facility] at [phone] with questions or concerns regarding your invoice.   IF you received labwork today, you will receive an invoice from [place]. Please contact [facility] at [phone] with questions or concerns regarding your invoice.   Our billing staff will not be able to assist you with questions regarding bills from these companies.  You will be contacted with the lab results as soon as they are available. The fastest way to get your results is to activate your My Chart account. Instructions are located on the last page of this paperwork. If you have not heard from [place] regarding the results in 2 weeks, please contact this office.

## 2017-10-26 NOTE — Progress Notes (Signed)
Subjective:  By signing my name below, I, Stann Ore, attest that this documentation has been prepared under the direction and in the presence of Meredith Staggers, MD. Electronically Signed: Stann Ore, Scribe. 10/26/2017 , 4:50 PM .  Patient was seen in Room 12 .   Patient ID: Dominic Gibson, male    DOB: [birth year], 32 y.o.   MRN: [id number] Chief Complaint  Patient presents with  . Back Pain    referral to orthopedics (Dr.Dumonski)   HPI Dominic Gibson is a 32 y.o. male  Here for back pain; last seen in June. At that time, he reported left sided low back pain with left sided sciatica. He was prescribed meloxicam and flexeril as needed. He had MRI of L-spine in Sept 2018 with L5-S1 disc protrusion with contact nerve root on the right. He was treated with PT, chiropractor, back brace, lidocaine patches, TENS unit and prednisone. He had stopped meloxicam due to minimal relief. He switched to naproxen 440 mg qd. He was having upper back pain as well, more left sided than right sided when discussed in May.   Patient reports he's stopped taking NSAIDs, and now taking Tumeric. He still takes flexeril occasionally, maybe once a week. He is still doing physical therapy. He still has pain over his upper back at the base of his neck, and low back. He has occasional numbness with pins and needles feeling in his legs, left more than right. He denies urinary or bowel incontinence, saddle anesthesia, or weakness. He would like to be referred to Dr. Yevette Edwards.    Patient Active Problem List   Diagnosis Date Noted  . Low back pain 06/30/2016  . Pelvic pain in male 10/14/2015   Past Medical History:  Diagnosis Date  . Allergy    Past Surgical History:  Procedure Laterality Date  . COSMETIC SURGERY     Allergies  Allergen Reactions  . Sulfa Antibiotics Rash   Prior to Admission medications   Medication Sig Start Date End Date Taking? Authorizing Provider  amitriptyline (ELAVIL) 10 MG  tablet  06/08/16  Yes [provider]  amphetamine-dextroamphetamine (ADDERALL) 20 MG tablet  06/18/16  Yes [provider]  cyclobenzaprine (FLEXERIL) 5 MG tablet Take 1 tablet (5 mg total) by mouth at bedtime as needed for muscle spasms (start qhs prn due to sedation). 06/07/17  Yes Shade Flood, MD  EPINEPHrine (EPIPEN 2-PAK) 0.3 mg/0.3 mL IJ SOAJ injection  04/21/15  Yes [provider]  hydrOXYzine (ATARAX/VISTARIL) 25 MG tablet Take 25 mg by mouth every 8 (eight) hours as needed. 05/10/17  Yes [provider]  zolpidem (AMBIEN) 10 MG tablet Take 10 mg by mouth at bedtime as needed. for sleep 03/03/17  Yes [provider]   Social History   Socioeconomic History  . Marital status: Single    Spouse name: Not on file  . Number of children: Not on file  . Years of education: Not on file  . Highest education level: Not on file  Occupational History  . Not on file  Social Needs  . Financial resource strain: Not on file  . Food insecurity:    Worry: Not on file    Inability: Not on file  . Transportation needs:    Medical: Not on file    Non-medical: Not on file  Tobacco Use  . Smoking status: Former Games developer  . Smokeless tobacco: Never Used  Substance and Sexual Activity  . Alcohol use: Yes  .  Drug use: No  . Sexual activity: Not on file  Lifestyle  . Physical activity:    Days per week: Not on file    Minutes per session: Not on file  . Stress: Not on file  Relationships  . Social connections:    Talks on phone: Not on file    Gets together: Not on file    Attends religious service: Not on file    Active member of club or organization: Not on file    Attends meetings of clubs or organizations: Not on file    Relationship status: Not on file  . Intimate partner violence:    Fear of current or ex partner: Not on file    Emotionally abused: Not on file    Physically abused: Not on file    Forced sexual activity: Not on file  Other  Topics Concern  . Not on file  Social History Narrative   Pt lives alone in 1 story home   Does not have any children at this time   Highest level of education: RN, BSN   Works as Charity fundraiser in [employer]   Review of Systems  Constitutional: Negative for fatigue and unexpected weight change.  Eyes: Negative for visual disturbance.  Respiratory: Negative for cough, chest tightness and shortness of breath.   Cardiovascular: Negative for chest pain, palpitations and leg swelling.  Gastrointestinal: Negative for abdominal pain and blood in stool.  Musculoskeletal: Positive for back pain.  Neurological: Positive for numbness. Negative for dizziness, weakness, light-headedness and headaches.       Objective:   Physical Exam  Constitutional: He is oriented to person, place, and time. He appears well-developed and well-nourished. No distress.  HENT:  Head: Normocephalic and atraumatic.  Eyes: Pupils are equal, round, and reactive to light. EOM are normal.  Neck: Neck supple.  Cardiovascular: Normal rate.  Pulmonary/Chest: Effort normal. No respiratory distress.  Musculoskeletal: Normal range of motion.  Negative straight leg raise bilaterally; no midline bony tenderness, describes left lower paraspinals toward the SI joint, sciatic notch non tender; L-spine flexion to approximately 80 degrees, extension intact, rotations intact, and lateral flexion intact  Neurological: He is alert and oriented to person, place, and time.  Reflex Scores:      Patellar reflexes are 2+ on the right side and 2+ on the left side.      Achilles reflexes are 2+ on the right side and 2+ on the left side. Skin: Skin is warm and dry.  Psychiatric: He has a normal mood and affect. His behavior is normal.  Nursing note and vitals reviewed.   Vitals:   10/26/17 1603  BP: 108/72  Pulse: 83  Temp: 98.1 F (36.7 C)  TempSrc: Oral  SpO2: 97%  Weight: 184 lb 12.8 oz (83.8 kg)  Height: 6\' 3"  (1.905 m)         Assessment & Plan:   Dominic Gibson is a 32 y.o. male Bilateral low back pain with left-sided sciatica, unspecified chronicity - Plan: Ambulatory referral to Spine Surgery  -Recurrent low back pain.  Prior disc bulge on MRI, but has had symptoms on contralateral side as well.  Will refer to orthopedic back specialist to decide on next step in treatment or possible repeat advanced imaging.  Symptomatic care discussed for right now including Flexeril if needed and RTC precautions.  No orders of the defined types were placed in this encounter.  Patient Instructions     I will refer  you to Dr. Yevette Edwards.  Okay to use Flexeril as needed for now, continue stretches and range of motion as tolerated.  Let me know if I can help further and thanks for coming in today.    Back Pain, Adult Many adults have back pain from time to time. Common causes of back pain include:  A strained muscle or ligament.  Wear and tear (degeneration) of the spinal disks.  Arthritis.  A hit to the back.  Back pain can be short-lived (acute) or last a long time (chronic). A physical exam, lab tests, and imaging studies may be done to find the cause of your pain. Follow these instructions at home: Managing pain and stiffness  Take over-the-counter and prescription medicines only as told by your health care provider.  If directed, apply heat to the affected area as often as told by your health care provider. Use the heat source that your health care provider recommends, such as a moist heat pack or a heating pad. ? Place a towel between your skin and the heat source. ? Leave the heat on for 20-30 minutes. ? Remove the heat if your skin turns bright red. This is especially important if you are unable to feel pain, heat, or cold. You have a greater risk of getting burned.  If directed, apply ice to the injured area: ? Put ice in a plastic bag. ? Place a towel between your skin and the bag. ? Leave the ice on for  20 minutes, 2-3 times a day for the first 2-3 days. Activity  Do not stay in bed. Resting more than 1-2 days can delay your recovery.  Take short walks on even surfaces as soon as you are able. Try to increase the length of time you walk each day.  Do not sit, drive, or stand in one place for more than 30 minutes at a time. Sitting or standing for long periods of time can put stress on your back.  Use proper lifting techniques. When you bend and lift, use positions that put less stress on your back: ? [place] your knees. ? Keep the load close to your body. ? Avoid twisting.  Exercise regularly as told by your health care provider. Exercising will help your back heal faster. This also helps prevent back injuries by keeping muscles strong and flexible.  Your health care provider may recommend that you see a physical therapist. This person can help you come up with a safe exercise program. Do any exercises as told by your physical therapist. Lifestyle  Maintain a healthy weight. Extra weight puts stress on your back and makes it difficult to have good posture.  Avoid activities or situations that make you feel anxious or stressed. Learn ways to manage anxiety and stress. One way to manage stress is through exercise. Stress and anxiety increase muscle tension and can make back pain worse. General instructions  Sleep on a firm mattress in a comfortable position. Try lying on your side with your knees slightly bent. If you lie on your back, put a pillow under your knees.  Follow your treatment plan as told by your health care provider. This may include: ? Cognitive or behavioral therapy. ? Acupuncture or massage therapy. ? Meditation or yoga. Contact a health care provider if:  You have pain that is not relieved with rest or medicine.  You have increasing pain going down into your legs or buttocks.  Your pain does not improve in 2 weeks.  You  have pain at night.  You lose  weight.  You have a fever or chills. Get help right away if:  You develop new bowel or bladder control problems.  You have unusual weakness or numbness in your arms or legs.  You develop nausea or vomiting.  You develop abdominal pain.  You feel faint. Summary  Many adults have back pain from time to time. A physical exam, lab tests, and imaging studies may be done to find the cause of your pain.  Use proper lifting techniques. When you bend and lift, use positions that put less stress on your back.  Take over-the-counter and prescription medicines and apply heat or ice as directed by your health care provider. This information is not intended to replace advice given to you by your health care provider. Make sure you discuss any questions you have with your health care provider. Document Released: 12/21/2004 Document Revised: 01/26/2016 Document Reviewed: 01/26/2016 Elsevier Interactive Patient Education  [employer].   If you have lab work done today you will be contacted with your lab results within the next 2 weeks.  If you have not heard from [place] then please contact us. The fastest way to get your results is to register for My Chart.   IF you received an x-ray today, you will receive an invoice from [facility]. Please contact [facility] at [phone] with questions or concerns regarding your invoice.   IF you received labwork today, you will receive an invoice from [place]. Please contact [facility] at [phone] with questions or concerns regarding your invoice.   Our billing staff will not be able to assist you with questions regarding bills from these companies.  You will be contacted with the lab results as soon as they are available. The fastest way to get your results is to activate your My Chart account. Instructions are located on the last page of this paperwork. If you have not heard from [place] regarding the results in 2 weeks, please contact  this office.      I personally performed the services described in this documentation, which was scribed in my presence. The recorded information has been reviewed and considered for accuracy and completeness, addended by me as needed, and agree with information above.  Signed,   Meredith Staggers, MD Primary Care at [facility].  10/30/17 12:59 PM

## 2017-10-30 ENCOUNTER — Encounter: Payer: Self-pay | Admitting: Family Medicine

## 2017-11-03 MED FILL — DEXTROAMP-AMPHETAMIN 20 MG: 20 | 30 days supply | Qty: 45 | Fill #0

## 2017-11-25 DIAGNOSIS — M533 Sacrococcygeal disorders, not elsewhere classified: Secondary | ICD-10-CM | POA: Diagnosis not present

## 2017-11-29 ENCOUNTER — Other Ambulatory Visit: Payer: Self-pay | Admitting: Orthopedic Surgery

## 2017-11-29 DIAGNOSIS — M533 Sacrococcygeal disorders, not elsewhere classified: Secondary | ICD-10-CM

## 2017-12-08 MED FILL — hydrOXYzine HCL 25 MG TABS: 25 | 90 days supply | Qty: 90 | Fill #0

## 2017-12-08 MED FILL — DEXTROAMP-AMPHETAMIN 20 MG: 20 | 30 days supply | Qty: 45 | Fill #0

## 2017-12-21 ENCOUNTER — Inpatient Hospital Stay: Admission: RE | Admit: 2017-12-21 | Payer: 59 | Source: Ambulatory Visit

## 2017-12-21 ENCOUNTER — Other Ambulatory Visit: Payer: 59

## 2017-12-30 DIAGNOSIS — M533 Sacrococcygeal disorders, not elsewhere classified: Secondary | ICD-10-CM | POA: Diagnosis not present

## 2018-01-09 MED FILL — AMITRIPTYLINE HCL 10 MG TAB: 10 | 90 days supply | Qty: 90 | Fill #0

## 2018-01-09 MED FILL — CYCLOBENZAPRINE 10 MG TAB: 10 | 7 days supply | Qty: 20 | Fill #0

## 2018-01-18 DIAGNOSIS — M461 Sacroiliitis, not elsewhere classified: Secondary | ICD-10-CM | POA: Diagnosis not present

## 2018-01-18 DIAGNOSIS — M545 Low back pain: Secondary | ICD-10-CM | POA: Diagnosis not present

## 2018-01-18 MED FILL — DEXTROAMP-AMPHETAMIN 20 MG: 20 | 30 days supply | Qty: 45 | Fill #0

## 2018-01-23 DIAGNOSIS — M545 Low back pain: Secondary | ICD-10-CM | POA: Diagnosis not present

## 2018-01-23 DIAGNOSIS — M461 Sacroiliitis, not elsewhere classified: Secondary | ICD-10-CM | POA: Diagnosis not present

## 2018-01-26 DIAGNOSIS — M461 Sacroiliitis, not elsewhere classified: Secondary | ICD-10-CM | POA: Diagnosis not present

## 2018-01-26 DIAGNOSIS — M545 Low back pain: Secondary | ICD-10-CM | POA: Diagnosis not present

## 2018-01-31 DIAGNOSIS — M461 Sacroiliitis, not elsewhere classified: Secondary | ICD-10-CM | POA: Diagnosis not present

## 2018-01-31 DIAGNOSIS — M545 Low back pain: Secondary | ICD-10-CM | POA: Diagnosis not present

## 2018-02-03 DIAGNOSIS — M461 Sacroiliitis, not elsewhere classified: Secondary | ICD-10-CM | POA: Diagnosis not present

## 2018-02-03 DIAGNOSIS — M545 Low back pain: Secondary | ICD-10-CM | POA: Diagnosis not present

## 2018-02-07 DIAGNOSIS — M545 Low back pain: Secondary | ICD-10-CM | POA: Diagnosis not present

## 2018-02-07 DIAGNOSIS — M461 Sacroiliitis, not elsewhere classified: Secondary | ICD-10-CM | POA: Diagnosis not present

## 2018-02-10 DIAGNOSIS — M461 Sacroiliitis, not elsewhere classified: Secondary | ICD-10-CM | POA: Diagnosis not present

## 2018-02-10 DIAGNOSIS — M545 Low back pain: Secondary | ICD-10-CM | POA: Diagnosis not present

## 2018-02-14 DIAGNOSIS — M545 Low back pain: Secondary | ICD-10-CM | POA: Diagnosis not present

## 2018-02-14 DIAGNOSIS — M461 Sacroiliitis, not elsewhere classified: Secondary | ICD-10-CM | POA: Diagnosis not present

## 2018-02-16 DIAGNOSIS — M461 Sacroiliitis, not elsewhere classified: Secondary | ICD-10-CM | POA: Diagnosis not present

## 2018-02-16 DIAGNOSIS — M545 Low back pain: Secondary | ICD-10-CM | POA: Diagnosis not present

## 2018-02-20 DIAGNOSIS — M545 Low back pain: Secondary | ICD-10-CM | POA: Diagnosis not present

## 2018-02-20 DIAGNOSIS — M461 Sacroiliitis, not elsewhere classified: Secondary | ICD-10-CM | POA: Diagnosis not present

## 2018-02-21 MED FILL — DEXTROAMP-AMPHETAMIN 20 MG: 20 | 30 days supply | Qty: 45 | Fill #0

## 2018-02-23 DIAGNOSIS — M461 Sacroiliitis, not elsewhere classified: Secondary | ICD-10-CM | POA: Diagnosis not present

## 2018-02-23 DIAGNOSIS — M545 Low back pain: Secondary | ICD-10-CM | POA: Diagnosis not present

## 2018-03-09 DIAGNOSIS — M461 Sacroiliitis, not elsewhere classified: Secondary | ICD-10-CM | POA: Diagnosis not present

## 2018-03-09 DIAGNOSIS — M545 Low back pain: Secondary | ICD-10-CM | POA: Diagnosis not present

## 2018-03-14 DIAGNOSIS — M545 Low back pain: Secondary | ICD-10-CM | POA: Diagnosis not present

## 2018-03-14 DIAGNOSIS — M461 Sacroiliitis, not elsewhere classified: Secondary | ICD-10-CM | POA: Diagnosis not present

## 2018-03-15 DIAGNOSIS — G43109 Migraine with aura, not intractable, without status migrainosus: Secondary | ICD-10-CM | POA: Diagnosis not present

## 2018-03-15 DIAGNOSIS — G4726 Circadian rhythm sleep disorder, shift work type: Secondary | ICD-10-CM | POA: Diagnosis not present

## 2018-03-15 DIAGNOSIS — Z131 Encounter for screening for diabetes mellitus: Secondary | ICD-10-CM | POA: Diagnosis not present

## 2018-03-15 DIAGNOSIS — F988 Other specified behavioral and emotional disorders with onset usually occurring in childhood and adolescence: Secondary | ICD-10-CM | POA: Diagnosis not present

## 2018-03-15 DIAGNOSIS — Z13220 Encounter for screening for lipoid disorders: Secondary | ICD-10-CM | POA: Diagnosis not present

## 2018-03-15 DIAGNOSIS — Z0000 Encounter for general adult medical examination without abnormal findings: Secondary | ICD-10-CM | POA: Diagnosis not present

## 2018-03-15 DIAGNOSIS — N3010 Interstitial cystitis (chronic) without hematuria: Secondary | ICD-10-CM | POA: Diagnosis not present

## 2018-03-15 DIAGNOSIS — J309 Allergic rhinitis, unspecified: Secondary | ICD-10-CM | POA: Diagnosis not present

## 2018-03-29 MED FILL — AMPHETAMINE-DEXTROAMPHETAMI: 20 | 30 days supply | Qty: 45 | Fill #0

## 2018-03-29 MED FILL — hydrOXYzine HCL 25 MG TABS: 25 | 90 days supply | Qty: 90 | Fill #0

## 2018-05-02 MED FILL — AMITRIPTYLINE HCL 10 MG TAB: 10 | 90 days supply | Qty: 90 | Fill #0

## 2018-05-02 MED FILL — AMPHETAMINE-DEXTROAMPHETAMI: 20 | 30 days supply | Qty: 45 | Fill #0

## 2018-05-03 DIAGNOSIS — M21752 Unequal limb length (acquired), left femur: Secondary | ICD-10-CM | POA: Diagnosis not present

## 2018-05-03 DIAGNOSIS — M9901 Segmental and somatic dysfunction of cervical region: Secondary | ICD-10-CM | POA: Diagnosis not present

## 2018-05-03 DIAGNOSIS — M9900 Segmental and somatic dysfunction of head region: Secondary | ICD-10-CM | POA: Diagnosis not present

## 2018-05-03 DIAGNOSIS — R293 Abnormal posture: Secondary | ICD-10-CM | POA: Diagnosis not present

## 2018-05-05 DIAGNOSIS — M9900 Segmental and somatic dysfunction of head region: Secondary | ICD-10-CM | POA: Diagnosis not present

## 2018-05-05 DIAGNOSIS — M9901 Segmental and somatic dysfunction of cervical region: Secondary | ICD-10-CM | POA: Diagnosis not present

## 2018-05-05 DIAGNOSIS — R293 Abnormal posture: Secondary | ICD-10-CM | POA: Diagnosis not present

## 2018-05-05 DIAGNOSIS — M21752 Unequal limb length (acquired), left femur: Secondary | ICD-10-CM | POA: Diagnosis not present

## 2018-05-08 DIAGNOSIS — M9901 Segmental and somatic dysfunction of cervical region: Secondary | ICD-10-CM | POA: Diagnosis not present

## 2018-05-08 DIAGNOSIS — M9900 Segmental and somatic dysfunction of head region: Secondary | ICD-10-CM | POA: Diagnosis not present

## 2018-05-08 DIAGNOSIS — R293 Abnormal posture: Secondary | ICD-10-CM | POA: Diagnosis not present

## 2018-05-08 DIAGNOSIS — M21752 Unequal limb length (acquired), left femur: Secondary | ICD-10-CM | POA: Diagnosis not present

## 2018-05-11 DIAGNOSIS — M9901 Segmental and somatic dysfunction of cervical region: Secondary | ICD-10-CM | POA: Diagnosis not present

## 2018-05-11 DIAGNOSIS — M9900 Segmental and somatic dysfunction of head region: Secondary | ICD-10-CM | POA: Diagnosis not present

## 2018-05-11 DIAGNOSIS — R293 Abnormal posture: Secondary | ICD-10-CM | POA: Diagnosis not present

## 2018-05-11 DIAGNOSIS — M21752 Unequal limb length (acquired), left femur: Secondary | ICD-10-CM | POA: Diagnosis not present

## 2018-05-15 DIAGNOSIS — M9900 Segmental and somatic dysfunction of head region: Secondary | ICD-10-CM | POA: Diagnosis not present

## 2018-05-15 DIAGNOSIS — M9903 Segmental and somatic dysfunction of lumbar region: Secondary | ICD-10-CM | POA: Diagnosis not present

## 2018-05-15 DIAGNOSIS — M9904 Segmental and somatic dysfunction of sacral region: Secondary | ICD-10-CM | POA: Diagnosis not present

## 2018-05-15 DIAGNOSIS — M9901 Segmental and somatic dysfunction of cervical region: Secondary | ICD-10-CM | POA: Diagnosis not present

## 2018-05-16 DIAGNOSIS — M545 Low back pain: Secondary | ICD-10-CM | POA: Diagnosis not present

## 2018-05-16 DIAGNOSIS — M461 Sacroiliitis, not elsewhere classified: Secondary | ICD-10-CM | POA: Diagnosis not present

## 2018-05-17 DIAGNOSIS — M9901 Segmental and somatic dysfunction of cervical region: Secondary | ICD-10-CM | POA: Diagnosis not present

## 2018-05-17 DIAGNOSIS — M9900 Segmental and somatic dysfunction of head region: Secondary | ICD-10-CM | POA: Diagnosis not present

## 2018-05-17 DIAGNOSIS — M9904 Segmental and somatic dysfunction of sacral region: Secondary | ICD-10-CM | POA: Diagnosis not present

## 2018-05-17 DIAGNOSIS — M9903 Segmental and somatic dysfunction of lumbar region: Secondary | ICD-10-CM | POA: Diagnosis not present

## 2018-05-22 DIAGNOSIS — M9901 Segmental and somatic dysfunction of cervical region: Secondary | ICD-10-CM | POA: Diagnosis not present

## 2018-05-22 DIAGNOSIS — M9900 Segmental and somatic dysfunction of head region: Secondary | ICD-10-CM | POA: Diagnosis not present

## 2018-05-22 DIAGNOSIS — M9903 Segmental and somatic dysfunction of lumbar region: Secondary | ICD-10-CM | POA: Diagnosis not present

## 2018-05-22 DIAGNOSIS — M9904 Segmental and somatic dysfunction of sacral region: Secondary | ICD-10-CM | POA: Diagnosis not present

## 2018-05-24 DIAGNOSIS — M9901 Segmental and somatic dysfunction of cervical region: Secondary | ICD-10-CM | POA: Diagnosis not present

## 2018-05-24 DIAGNOSIS — M9903 Segmental and somatic dysfunction of lumbar region: Secondary | ICD-10-CM | POA: Diagnosis not present

## 2018-05-24 DIAGNOSIS — M9900 Segmental and somatic dysfunction of head region: Secondary | ICD-10-CM | POA: Diagnosis not present

## 2018-05-24 DIAGNOSIS — M9904 Segmental and somatic dysfunction of sacral region: Secondary | ICD-10-CM | POA: Diagnosis not present

## 2018-05-30 DIAGNOSIS — M545 Low back pain: Secondary | ICD-10-CM | POA: Diagnosis not present

## 2018-05-30 DIAGNOSIS — M461 Sacroiliitis, not elsewhere classified: Secondary | ICD-10-CM | POA: Diagnosis not present

## 2018-05-31 DIAGNOSIS — M9901 Segmental and somatic dysfunction of cervical region: Secondary | ICD-10-CM | POA: Diagnosis not present

## 2018-05-31 DIAGNOSIS — M9900 Segmental and somatic dysfunction of head region: Secondary | ICD-10-CM | POA: Diagnosis not present

## 2018-05-31 DIAGNOSIS — M9904 Segmental and somatic dysfunction of sacral region: Secondary | ICD-10-CM | POA: Diagnosis not present

## 2018-05-31 DIAGNOSIS — M9903 Segmental and somatic dysfunction of lumbar region: Secondary | ICD-10-CM | POA: Diagnosis not present

## 2018-06-07 DIAGNOSIS — M9904 Segmental and somatic dysfunction of sacral region: Secondary | ICD-10-CM | POA: Diagnosis not present

## 2018-06-07 DIAGNOSIS — M9900 Segmental and somatic dysfunction of head region: Secondary | ICD-10-CM | POA: Diagnosis not present

## 2018-06-07 DIAGNOSIS — M21752 Unequal limb length (acquired), left femur: Secondary | ICD-10-CM | POA: Diagnosis not present

## 2018-06-07 DIAGNOSIS — R293 Abnormal posture: Secondary | ICD-10-CM | POA: Diagnosis not present

## 2018-06-07 DIAGNOSIS — M9903 Segmental and somatic dysfunction of lumbar region: Secondary | ICD-10-CM | POA: Diagnosis not present

## 2018-06-07 DIAGNOSIS — M9901 Segmental and somatic dysfunction of cervical region: Secondary | ICD-10-CM | POA: Diagnosis not present

## 2018-06-08 DIAGNOSIS — M9904 Segmental and somatic dysfunction of sacral region: Secondary | ICD-10-CM | POA: Diagnosis not present

## 2018-06-08 DIAGNOSIS — M21752 Unequal limb length (acquired), left femur: Secondary | ICD-10-CM | POA: Diagnosis not present

## 2018-06-08 DIAGNOSIS — M9901 Segmental and somatic dysfunction of cervical region: Secondary | ICD-10-CM | POA: Diagnosis not present

## 2018-06-08 DIAGNOSIS — R293 Abnormal posture: Secondary | ICD-10-CM | POA: Diagnosis not present

## 2018-06-08 DIAGNOSIS — M9900 Segmental and somatic dysfunction of head region: Secondary | ICD-10-CM | POA: Diagnosis not present

## 2018-06-08 DIAGNOSIS — M9903 Segmental and somatic dysfunction of lumbar region: Secondary | ICD-10-CM | POA: Diagnosis not present

## 2018-06-09 MED FILL — DEXTROAMP-AMPHETAMIN 20 MG: 20 | 30 days supply | Qty: 45 | Fill #0

## 2018-06-12 DIAGNOSIS — M545 Low back pain: Secondary | ICD-10-CM | POA: Diagnosis not present

## 2018-06-12 DIAGNOSIS — M461 Sacroiliitis, not elsewhere classified: Secondary | ICD-10-CM | POA: Diagnosis not present

## 2018-06-13 DIAGNOSIS — R293 Abnormal posture: Secondary | ICD-10-CM | POA: Diagnosis not present

## 2018-06-13 DIAGNOSIS — M9900 Segmental and somatic dysfunction of head region: Secondary | ICD-10-CM | POA: Diagnosis not present

## 2018-06-13 DIAGNOSIS — M9901 Segmental and somatic dysfunction of cervical region: Secondary | ICD-10-CM | POA: Diagnosis not present

## 2018-06-13 DIAGNOSIS — M21752 Unequal limb length (acquired), left femur: Secondary | ICD-10-CM | POA: Diagnosis not present

## 2018-06-16 DIAGNOSIS — M9901 Segmental and somatic dysfunction of cervical region: Secondary | ICD-10-CM | POA: Diagnosis not present

## 2018-06-16 DIAGNOSIS — M21752 Unequal limb length (acquired), left femur: Secondary | ICD-10-CM | POA: Diagnosis not present

## 2018-06-16 DIAGNOSIS — R293 Abnormal posture: Secondary | ICD-10-CM | POA: Diagnosis not present

## 2018-06-16 DIAGNOSIS — M9900 Segmental and somatic dysfunction of head region: Secondary | ICD-10-CM | POA: Diagnosis not present

## 2018-06-26 DIAGNOSIS — M545 Low back pain: Secondary | ICD-10-CM | POA: Diagnosis not present

## 2018-06-26 DIAGNOSIS — M461 Sacroiliitis, not elsewhere classified: Secondary | ICD-10-CM | POA: Diagnosis not present

## 2018-07-10 DIAGNOSIS — M461 Sacroiliitis, not elsewhere classified: Secondary | ICD-10-CM | POA: Diagnosis not present

## 2018-07-10 DIAGNOSIS — M545 Low back pain: Secondary | ICD-10-CM | POA: Diagnosis not present

## 2018-07-21 MED FILL — CYCLOBENZAPRINE 10 MG TAB: 10 | 7 days supply | Qty: 20 | Fill #0

## 2018-07-21 MED FILL — AMPHETAMINE-DEXTRO 20MG: 20 | 30 days supply | Qty: 45 | Fill #0

## 2018-07-21 MED FILL — ZOLPIDEM TARTRATE 10 MG TAB: 10 | 20 days supply | Qty: 20 | Fill #0

## 2018-07-24 DIAGNOSIS — M545 Low back pain: Secondary | ICD-10-CM | POA: Diagnosis not present

## 2018-07-24 DIAGNOSIS — M461 Sacroiliitis, not elsewhere classified: Secondary | ICD-10-CM | POA: Diagnosis not present

## 2018-08-07 DIAGNOSIS — M545 Low back pain: Secondary | ICD-10-CM | POA: Diagnosis not present

## 2018-08-07 DIAGNOSIS — M461 Sacroiliitis, not elsewhere classified: Secondary | ICD-10-CM | POA: Diagnosis not present

## 2018-08-15 MED FILL — AMITRIPTYLINE HCL 10 MG TAB: 10 | 90 days supply | Qty: 90 | Fill #0

## 2018-08-21 DIAGNOSIS — M461 Sacroiliitis, not elsewhere classified: Secondary | ICD-10-CM | POA: Diagnosis not present

## 2018-08-21 DIAGNOSIS — M545 Low back pain: Secondary | ICD-10-CM | POA: Diagnosis not present

## 2018-08-29 MED FILL — hydrOXYzine HCL 25 MG TABS: 25 | 90 days supply | Qty: 90 | Fill #0

## 2018-08-29 MED FILL — DEXTROAMP-AMPHETAMIN 20 MG: 20 | 30 days supply | Qty: 45 | Fill #0

## 2018-10-02 MED FILL — DEXTROAMP-AMPHETAMIN 20 MG: 20 | 30 days supply | Qty: 45 | Fill #0

## 2018-10-18 ENCOUNTER — Ambulatory Visit: Payer: 59 | Admitting: Family Medicine

## 2018-10-18 ENCOUNTER — Encounter: Payer: Self-pay | Admitting: Family Medicine

## 2018-10-18 ENCOUNTER — Other Ambulatory Visit: Payer: Self-pay

## 2018-10-18 VITALS — BP 115/76 | HR 78 | Temp 97.7°F | Wt 195.0 lb

## 2018-10-18 DIAGNOSIS — M222X2 Patellofemoral disorders, left knee: Secondary | ICD-10-CM

## 2018-10-18 DIAGNOSIS — M222X1 Patellofemoral disorders, right knee: Secondary | ICD-10-CM

## 2018-10-18 NOTE — Progress Notes (Signed)
Subjective:    Patient ID: Dominic Gibson, male    DOB: February 11, 1985, 33 y.o.   MRN: [id number]  HPI Dominic Gibson is a 33 y.o. male Presents today for: Chief Complaint  Patient presents with  . Knee Pain    Pain in both knee for the past 3 few weeks. Right below knee cap with no injury. Pain started the next day post 12 hr drive to [place]    Bilateral knee pain: Drove to [place] 3 weeks ago.  No initial pain, but noticed irritation after returning from that visit.  Pain under kneecaps.  Sometimes at night. Ache at times, sporadic.  Stairs worse - going down, or after prolonged sitting.  In physical theapy for back, bird dog and planks - on knees more.   Pain better past week, but had been there for few weeks.   Tx:  Nsaids- naprosyn 220mg  BID, turmeric. Compression sleeve.   Patient Active Problem List   Diagnosis Date Noted  . Low back pain 06/30/2016  . Pelvic pain in male 10/14/2015   Past Medical History:  Diagnosis Date  . Allergy    Past Surgical History:  Procedure Laterality Date  . COSMETIC SURGERY     Allergies  Allergen Reactions  . Sulfa Antibiotics Rash   Prior to Admission medications   Medication Sig Start Date End Date Taking? Authorizing Provider  amitriptyline (ELAVIL) 10 MG tablet  06/08/16  Yes [provider]  amphetamine-dextroamphetamine (ADDERALL) 20 MG tablet  06/18/16  Yes [provider]  cyclobenzaprine (FLEXERIL) 5 MG tablet Take 1 tablet (5 mg total) by mouth at bedtime as needed for muscle spasms (start qhs prn due to sedation). 06/07/17  Yes Wendie Agreste, MD  EPINEPHrine (EPIPEN 2-PAK) 0.3 mg/0.3 mL IJ SOAJ injection  04/21/15  Yes [provider]  hydrOXYzine (ATARAX/VISTARIL) 25 MG tablet Take 25 mg by mouth at bedtime.  05/10/17  Yes [provider]  zolpidem (AMBIEN) 10 MG tablet Take 10 mg by mouth at bedtime as needed. for sleep 03/03/17  Yes [provider]   Social History    Socioeconomic History  . Marital status: Significant Other    Spouse name: Not on file  . Number of children: Not on file  . Years of education: Not on file  . Highest education level: Not on file  Occupational History  . Not on file  Social Needs  . Financial resource strain: Not on file  . Food insecurity    Worry: Not on file    Inability: Not on file  . Transportation needs    Medical: Not on file    Non-medical: Not on file  Tobacco Use  . Smoking status: Former Research scientist (life sciences)  . Smokeless tobacco: Never Used  Substance and Sexual Activity  . Alcohol use: Yes  . Drug use: No  . Sexual activity: Not on file  Lifestyle  . Physical activity    Days per week: Not on file    Minutes per session: Not on file  . Stress: Not on file  Relationships  . Social Herbalist on phone: Not on file    Gets together: Not on file    Attends religious service: Not on file    Active member of club or organization: Not on file    Attends meetings of clubs or organizations: Not on file    Relationship status: Not on file  . Intimate partner violence  Fear of current or ex partner: Not on file    Emotionally abused: Not on file    Physically abused: Not on file    Forced sexual activity: Not on file  Other Topics Concern  . 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     Objective:   Physical Exam Constitutional:      General: He is not in acute distress.    Appearance: He is well-developed.  HENT:     Head: Normocephalic and atraumatic.  Cardiovascular:     Rate and Rhythm: Normal rate.  Pulmonary:     Effort: Pulmonary effort is normal.  Musculoskeletal:     Right knee: He exhibits abnormal patellar mobility. He exhibits normal range of motion, no swelling, no effusion and no ecchymosis. No  tenderness found. No medial joint line and no lateral joint line tenderness noted.     Left knee: He exhibits abnormal patellar mobility. He exhibits normal range of motion, no swelling, no effusion and no ecchymosis. No tenderness found. No medial joint line and no lateral joint line tenderness noted.     Comments: Slight J sign of patella with flexion/extension, fair VMO bulk bilaterally.  Positive Clark's compression test over patella bilaterally with reproduction of discomfort.  No effusion, negative Lachman's, McMurray's, varus and valgus stress bilaterally  Neurological:     Mental Status: He is alert and oriented to person, place, and time.    Vitals:   10/18/18 1110  BP: 115/76  Pulse: 78  Temp: 97.7 F (36.5 C)  TempSrc: Oral  SpO2: 98%  Weight: 195 lb (88.5 kg)          Assessment & Plan:   Dominic Gibson is a 33 y.o. male Patellofemoral arthralgia of both knees Location and pattern consistent with patellofemoral pain syndrome.  Has had some improvement.  Previous exercises with pressure on the knee plus travel to [place] in the car may have contributed.   - Symptomatic care discussed, VMO strengthening and avoidance of knee walking/direct pressure to the front of the knee for now.  RTC precautions.  No orders of the defined types were placed in this encounter.  Patient Instructions     Knee pain appears to be due to patellofemoral pain syndrome. Exercises for quads/VMO strengthening can help, try to avoid activities with direct pressure to front of knee. Follow up in next 4-6 weeks if not improving. Return to the clinic or go to the nearest emergency room if any of your symptoms worsen or new symptoms occur.   Patellofemoral Pain Syndrome  Patellofemoral pain syndrome is a condition in which the tissue (cartilage) on the underside of the kneecap (patella) softens or breaks down. This causes pain in the front of the knee. The condition is also called runner's knee or  chondromalacia patella. Patellofemoral pain syndrome is most common in young adults who are active in sports. The knee is the largest joint in the body. The patella covers the front of the knee and is attached to muscles above and below the knee. The underside of the patella is covered with a smooth type of cartilage (synovium). The smooth surface helps the patella to glide easily when you move your knee. Patellofemoral pain syndrome causes  swelling in the joint linings and bone surfaces in the knee. What are the causes? This condition may be caused by:  Overuse of the knee.  Poor alignment of your knee joints.  Weak leg muscles.  A direct blow to your kneecap. What increases the risk? You are more likely to develop this condition if:  You do a lot of activities that can wear down your kneecap. These include: ? Running. ? Squatting. ? Climbing stairs.  You start a new physical activity or exercise program.  You wear shoes that do not fit well.  You do not have good leg strength.  You are overweight. What are the signs or symptoms? The main symptom of this condition is knee pain. This may feel like a dull, aching pain underneath your patella, in the front of your knee. There may be a popping or cracking sound when you move your knee. Pain may get worse with:  Exercise.  Climbing stairs.  Running.  Jumping.  Squatting.  Kneeling.  Sitting for a long time.  Moving or pushing on your patella. How is this diagnosed? This condition may be diagnosed based on:  Your symptoms and medical history. You may be asked about your recent physical activities and which ones cause knee pain.  A physical exam. This may include: ? Moving your patella back and forth. ? Checking your range of knee motion. ? Having you squat or jump to see if you have pain. ? Checking the strength of your leg muscles.  Imaging tests to confirm the diagnosis. These may include an MRI of your knee. How  is this treated? This condition may be treated at home with rest, ice, compression, and elevation (RICE).  Other treatments may include:  Nonsteroidal anti-inflammatory drugs (NSAIDs).  Physical therapy to stretch and strengthen your leg muscles.  Shoe inserts (orthotics) to take stress off your knee.  A knee brace or knee support.  Adhesive tapes to the skin.  Surgery to remove damaged cartilage or move the patella to a better position. This is rare. Follow these instructions at home: If you have a shoe or brace:  Wear the shoe or brace as told by your health care provider. Remove it only as told by your health care provider.  Loosen the shoe or brace if your toes tingle, become numb, or turn cold and blue.  Keep the shoe or brace clean.  If the shoe or brace is not waterproof: ? Do not let it get wet. ? Cover it with a watertight covering when you take a bath or a shower. Managing pain, stiffness, and swelling  If directed, put ice on the painful area. ? If you have a removable shoe or brace, remove it as told by your health care provider. ? Put ice in a plastic bag. ? Place a towel between your skin and the bag. ? Leave the ice on for 20 minutes, 2-3 times a day.  Move your toes often to avoid stiffness and to lessen swelling.  Rest your knee: ? Avoid activities that cause knee pain. ? When sitting or lying down, raise (elevate) the injured area above the level of your heart, whenever possible. General instructions  Take over-the-counter and prescription medicines only as told by your health care provider.  Use splints, braces, knee supports, or walking aids as directed by your health care provider.  Perform stretching and strengthening exercises as told by your health care provider or physical therapist.  Do not use any products  that contain nicotine or tobacco, such as cigarettes and e-cigarettes. These can delay healing. If you need help quitting, ask your health  care provider.  Return to your normal activities as told by your health care provider. Ask your health care provider what activities are safe for you.  Keep all follow-up visits as told by your health care provider. This is important. Contact a health care provider if:  Your symptoms get worse.  You are not improving with home care. Summary  Patellofemoral pain syndrome is a condition in which the tissue (cartilage) on the underside of the kneecap (patella) softens or breaks down.  This condition causes swelling in the joint linings and bone surfaces in the knee. This leads to pain in the front of the knee.  This condition may be treated at home with rest, ice, compression, and elevation (RICE).  Use splints, braces, knee supports, or walking aids as directed by your health care provider. This information is not intended to replace advice given to you by your health care provider. Make sure you discuss any questions you have with your health care provider. Document Released: 12/09/2008 Document Revised: 01/31/2017 Document Reviewed: 01/31/2017 Elsevier Patient Education  [employer].   If you have lab work done today you will be contacted with your lab results within the next 2 weeks.  If you have not heard from [place] then please contact us. The fastest way to get your results is to register for My Chart.   IF you received an x-ray today, you will receive an invoice from [facility]. Please contact [facility] Radiology at [phone] with questions or concerns regarding your invoice.   IF you received labwork today, you will receive an invoice from [place]. Please contact [facility] at [phone] with questions or concerns regarding your invoice.   Our billing staff will not be able to assist you with questions regarding bills from these companies.  You will be contacted with the lab results as soon as they are available. The fastest way to get your results is to  activate your My Chart account. Instructions are located on the last page of this paperwork. If you have not heard from [place] regarding the results in 2 weeks, please contact this office.       Signed,   Meredith Staggers, MD Primary Care at [facility].  10/18/18 7:19 PM

## 2018-10-18 NOTE — Patient Instructions (Addendum)
Knee pain appears to be due to patellofemoral pain syndrome. Exercises for quads/VMO strengthening can help, try to avoid activities with direct pressure to front of knee. Follow up in next 4-6 weeks if not improving. Return to the clinic or go to the nearest emergency room if any of your symptoms worsen or new symptoms occur.   Patellofemoral Pain Syndrome  Patellofemoral pain syndrome is a condition in which the tissue (cartilage) on the underside of the kneecap (patella) softens or breaks down. This causes pain in the front of the knee. The condition is also called runner's knee or chondromalacia patella. Patellofemoral pain syndrome is most common in young adults who are active in sports. The knee is the largest joint in the body. The patella covers the front of the knee and is attached to muscles above and below the knee. The underside of the patella is covered with a smooth type of cartilage (synovium). The smooth surface helps the patella to glide easily when you move your knee. Patellofemoral pain syndrome causes swelling in the joint linings and bone surfaces in the knee. What are the causes? This condition may be caused by:  Overuse of the knee.  Poor alignment of your knee joints.  Weak leg muscles.  A direct blow to your kneecap. What increases the risk? You are more likely to develop this condition if:  You do a lot of activities that can wear down your kneecap. These include: ? Running. ? Squatting. ? Climbing stairs.  You start a new physical activity or exercise program.  You wear shoes that do not fit well.  You do not have good leg strength.  You are overweight. What are the signs or symptoms? The main symptom of this condition is knee pain. This may feel like a dull, aching pain underneath your patella, in the front of your knee. There may be a popping or cracking sound when you move your knee. Pain may get worse with:  Exercise.  Climbing  stairs.  Running.  Jumping.  Squatting.  Kneeling.  Sitting for a long time.  Moving or pushing on your patella. How is this diagnosed? This condition may be diagnosed based on:  Your symptoms and medical history. You may be asked about your recent physical activities and which ones cause knee pain.  A physical exam. This may include: ? Moving your patella back and forth. ? Checking your range of knee motion. ? Having you squat or jump to see if you have pain. ? Checking the strength of your leg muscles.  Imaging tests to confirm the diagnosis. These may include an MRI of your knee. How is this treated? This condition may be treated at home with rest, ice, compression, and elevation (RICE).  Other treatments may include:  Nonsteroidal anti-inflammatory drugs (NSAIDs).  Physical therapy to stretch and strengthen your leg muscles.  Shoe inserts (orthotics) to take stress off your knee.  A knee brace or knee support.  Adhesive tapes to the skin.  Surgery to remove damaged cartilage or move the patella to a better position. This is rare. Follow these instructions at home: If you have a shoe or brace:  Wear the shoe or brace as told by your health care provider. Remove it only as told by your health care provider.  Loosen the shoe or brace if your toes tingle, become numb, or turn cold and blue.  Keep the shoe or brace clean.  If the shoe or brace is not waterproof: ?  Do not let it get wet. ? Cover it with a watertight covering when you take a bath or a shower. Managing pain, stiffness, and swelling  If directed, put ice on the painful area. ? If you have a removable shoe or brace, remove it as told by your health care provider. ? Put ice in a plastic bag. ? Place a towel between your skin and the bag. ? Leave the ice on for 20 minutes, 2-3 times a day.  Move your toes often to avoid stiffness and to lessen swelling.  Rest your knee: ? Avoid activities that  cause knee pain. ? When sitting or lying down, raise (elevate) the injured area above the level of your heart, whenever possible. General instructions  Take over-the-counter and prescription medicines only as told by your health care provider.  Use splints, braces, knee supports, or walking aids as directed by your health care provider.  Perform stretching and strengthening exercises as told by your health care provider or physical therapist.  Do not use any products that contain nicotine or tobacco, such as cigarettes and e-cigarettes. These can delay healing. If you need help quitting, ask your health care provider.  Return to your normal activities as told by your health care provider. Ask your health care provider what activities are safe for you.  Keep all follow-up visits as told by your health care provider. This is important. Contact a health care provider if:  Your symptoms get worse.  You are not improving with home care. Summary  Patellofemoral pain syndrome is a condition in which the tissue (cartilage) on the underside of the kneecap (patella) softens or breaks down.  This condition causes swelling in the joint linings and bone surfaces in the knee. This leads to pain in the front of the knee.  This condition may be treated at home with rest, ice, compression, and elevation (RICE).  Use splints, braces, knee supports, or walking aids as directed by your health care provider. This information is not intended to replace advice given to you by your health care provider. Make sure you discuss any questions you have with your health care provider. Document Released: 12/09/2008 Document Revised: 01/31/2017 Document Reviewed: 01/31/2017 Elsevier Patient Education  [employer].   If you have lab work done today you will be contacted with your lab results within the next 2 weeks.  If you have not heard from [place] then please contact us. The fastest way to get your results is  to register for My Chart.   IF you received an x-ray today, you will receive an invoice from [facility]. Please contact [facility] at [phone] with questions or concerns regarding your invoice.   IF you received labwork today, you will receive an invoice from [employer]. Please contact [facility] at [phone] with questions or concerns regarding your invoice.   Our billing staff will not be able to assist you with questions regarding bills from these companies.  You will be contacted with the lab results as soon as they are available. The fastest way to get your results is to activate your My Chart account. Instructions are located on the last page of this paperwork. If you have not heard from [place] regarding the results in 2 weeks, please contact this office.

## 2018-11-20 MED FILL — hydrOXYzine HCL 25 MG TABS: 25 | 90 days supply | Qty: 90 | Fill #0

## 2018-11-20 MED FILL — ZOLPIDEM TARTRATE 10 MG TAB: 10 | 20 days supply | Qty: 20 | Fill #0

## 2018-11-20 MED FILL — DEXTROAMP-AMPHETAMIN 20 MG: 20 | 30 days supply | Qty: 45 | Fill #0

## 2018-11-22 DIAGNOSIS — Z20828 Contact with and (suspected) exposure to other viral communicable diseases: Secondary | ICD-10-CM | POA: Diagnosis not present

## 2018-11-23 MED FILL — CYCLOBENZAPRINE 10 MG TAB: 10 | 6 days supply | Qty: 20 | Fill #0

## 2018-11-23 MED FILL — AMITRIPTYLINE HCL 10 MG TAB: 10 | 90 days supply | Qty: 90 | Fill #1

## 2019-01-02 MED FILL — DEXTROAMP-AMPHETAMIN 20 MG: 20 | 30 days supply | Qty: 45 | Fill #0

## 2019-02-01 ENCOUNTER — Ambulatory Visit: Payer: 59 | Attending: Internal Medicine

## 2019-02-01 DIAGNOSIS — Z20822 Contact with and (suspected) exposure to covid-19: Secondary | ICD-10-CM | POA: Diagnosis not present

## 2019-02-02 LAB — NOVEL CORONAVIRUS, NAA: SARS-CoV-2, NAA: NOT DETECTED

## 2019-03-23 ENCOUNTER — Other Ambulatory Visit: Payer: Self-pay

## 2019-03-23 ENCOUNTER — Ambulatory Visit: Payer: Self-pay | Attending: Internal Medicine

## 2019-03-23 DIAGNOSIS — Z23 Encounter for immunization: Secondary | ICD-10-CM

## 2019-03-23 NOTE — Progress Notes (Signed)
   Covid-19 Vaccination Clinic  Name:  Dominic Gibson    MRN: [id number] DOB: [birth year]  03/23/2019  Mr. Dominic Gibson was observed post Covid-19 immunization for 15 minutes without incident. He was provided with Vaccine Information Sheet and instruction to access the V-Safe system.   Mr. Dominic Gibson was instructed to call 911 with any severe reactions post vaccine: Marland Kitchen Difficulty breathing  . Swelling of face and throat  . A fast heartbeat  . A bad rash all over body  . Dizziness and weakness   Immunizations Administered    Name Date Dose VIS Date Route   Pfizer COVID-19 Vaccine 03/23/2019  8:14 AM 0.3 mL 12/15/2018 Intramuscular   Manufacturer: [employer], [employer]   Lot: [id number]   NDC: [address]-9

## 2019-04-17 ENCOUNTER — Ambulatory Visit: Payer: Self-pay | Attending: Internal Medicine

## 2019-04-17 DIAGNOSIS — Z23 Encounter for immunization: Secondary | ICD-10-CM

## 2019-04-17 NOTE — Progress Notes (Signed)
   Covid-19 Vaccination Clinic  Name:  Dominic Gibson    MRN: [id number] DOB: [birth year]  04/17/2019  [name] was observed post Covid-19 immunization for 15 minutes without incident. He was provided with Vaccine Information Sheet and instruction to access the V-Safe system.   [name] was instructed to call 911 with any severe reactions post vaccine: Marland Kitchen Difficulty breathing  . Swelling of face and throat  . A fast heartbeat  . A bad rash all over body  . Dizziness and weakness   Immunizations Administered    Name Date Dose VIS Date Route   Pfizer COVID-19 Vaccine 04/17/2019  9:36 AM 0.3 mL 12/15/2018 Intramuscular   Manufacturer: [employer], [employer]   Lot: [id number]   NDC: [address]-5

## 2019-06-23 IMAGING — MR MR HEAD WO/W CM
12 of 13 series · 40 of 48 positions shown · IV contrast (17ml multihance)
Comparison: None.

CLINICAL DATA: Low back pain beginning 1 year ago. More recent
onset of neck pain with numbness in the legs and hands. Headaches
and blurred vision for 1.5 months. History of complicated migraines.
Evaluation for demyelinating disease.

EXAM:
MRI HEAD WITHOUT AND WITH CONTRAST
TECHNIQUE: Multiplanar, multiecho pulse sequences of the brain and surrounding
structures were obtained without and with intravenous contrast.
CONTRAST:  17 mL MultiHance

[Series 3: T1 · sagittal · 5.0mm · 0.45mm/px · 1 of 23 slices shown]
[im 1/23]
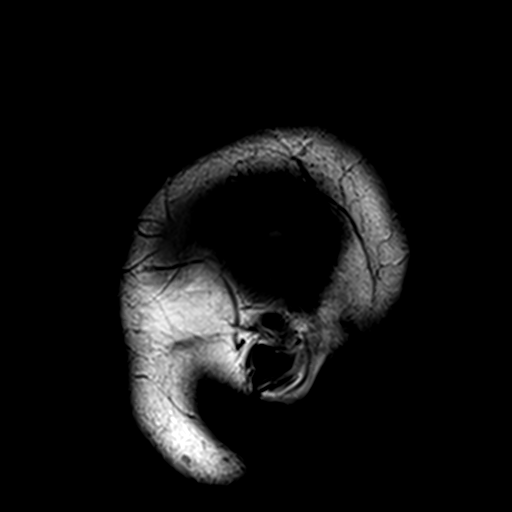

[Series 4: DWI · axial · 3.0mm · 1.80mm/px · z∈[-51,+103]mm · 6 of 108 slices shown (1 of 4)]
[im 1/108]
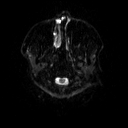
[im 22/108]
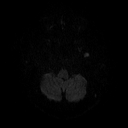
[im 43/108]
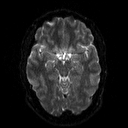
[im 65/108]
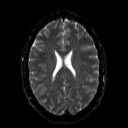
[im 86/108]
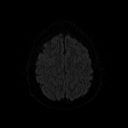
[im 108/108]
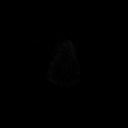

[Series 5: DWI · axial · 3.0mm · 1.80mm/px · z∈[-51,+103]mm · 3 of 54 slices shown (2 of 4)]
[im 1/54]
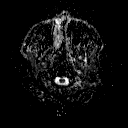
[im 27/54]
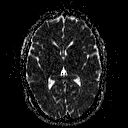
[im 54/54]
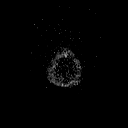

[Series 7: swi_images · axial · 2.0mm · 0.90mm/px · z∈[-50,+103]mm · 5 of 80 slices shown]
[im 1/80]
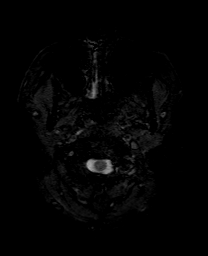
[im 20/80]
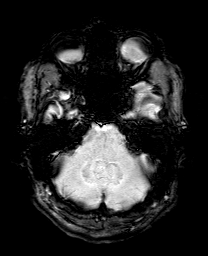
[im 40/80]
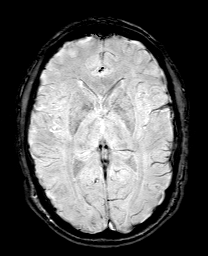
[im 60/80]
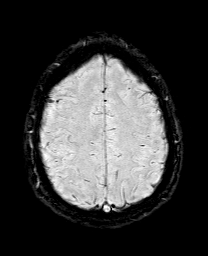
[im 80/80]
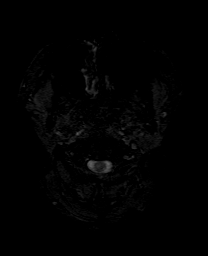

[Series 8: DWI · coronal · 5.0mm · 1.80mm/px · 4 of 75 slices shown (3 of 4)]
[im 1/75]
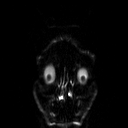
[im 25/75]
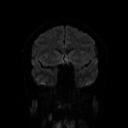
[im 50/75]
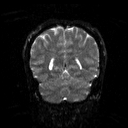
[im 75/75]
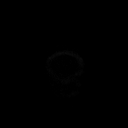

[Series 9: DWI · coronal · 5.0mm · 1.80mm/px · 2 of 38 slices shown (4 of 4)]
[im 1/38]
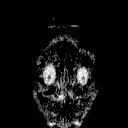
[im 38/38]
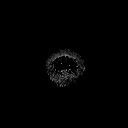

[Series 10: T2 · axial · 5.0mm · 0.51mm/px · 1 of 25 slices shown (1 of 2)]
[im 1/25]
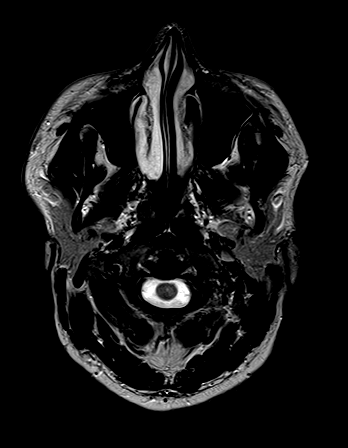

[Series 11: FLAIR · axial · 3.0mm · 0.45mm/px · z∈[-52,+104]mm · 2 of 28 slices shown (1 of 2)]
[im 1/28]
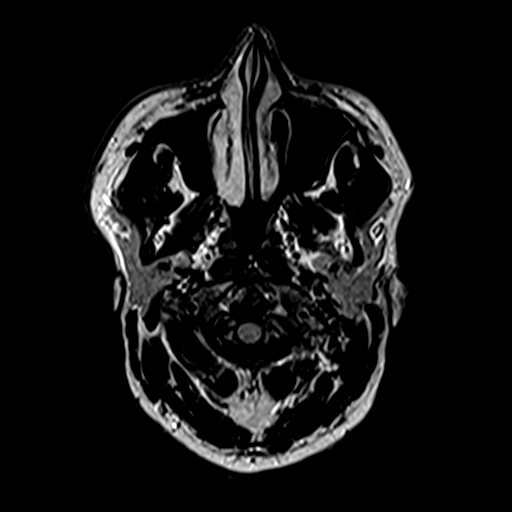
[im 28/28]
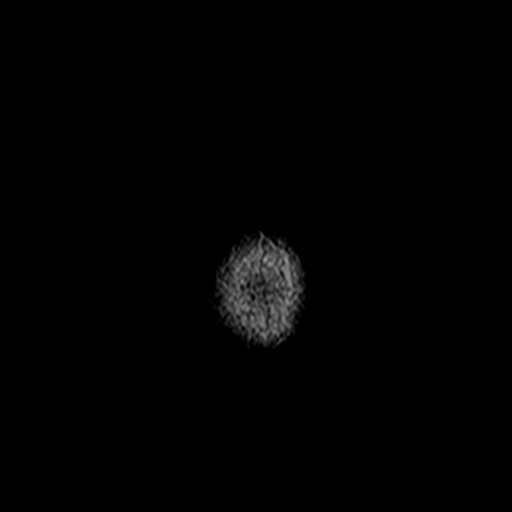

[Series 12: t1_mpr_tra · axial · 1.0mm · 0.45mm/px · z∈[-51,+103]mm · 8 of 160 slices shown (1 of 2)]
[im 1/160]
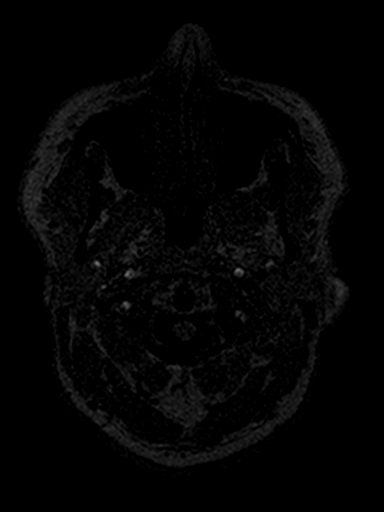
[im 20/160]
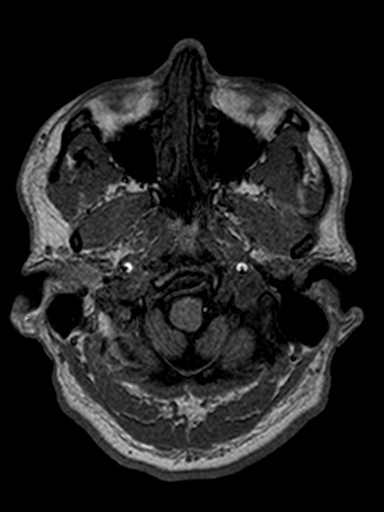
[im 40/160]
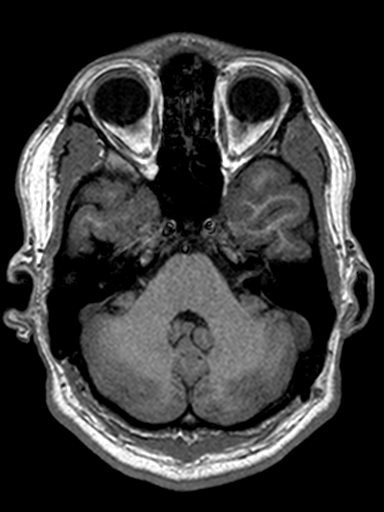
[im 60/160]
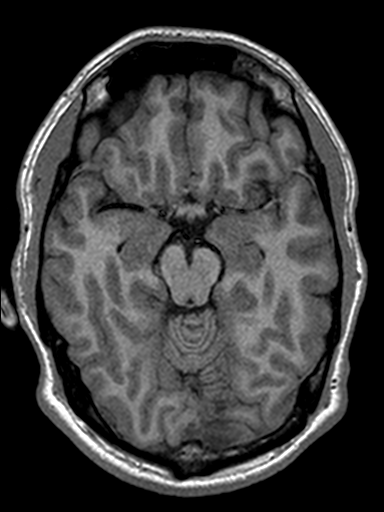
[im 100/160]
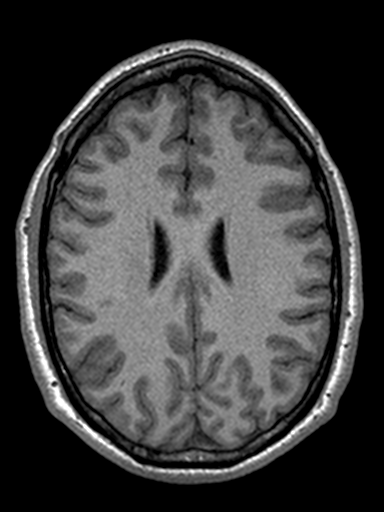
[im 120/160]
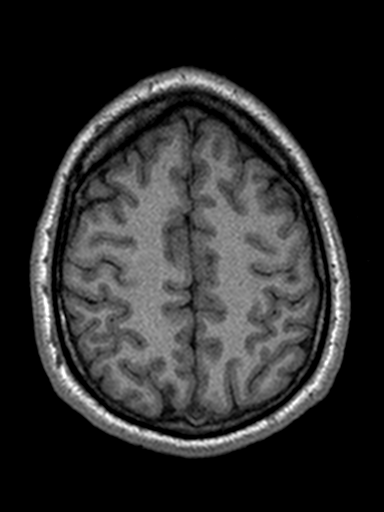
[im 140/160]
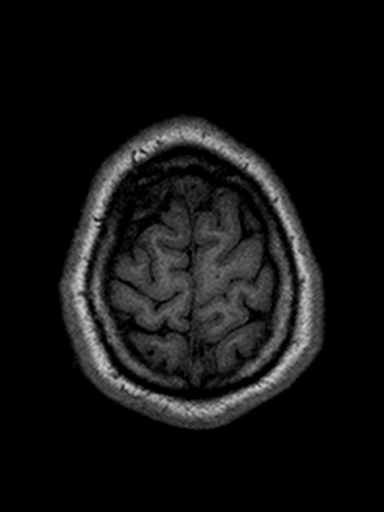
[im 160/160]
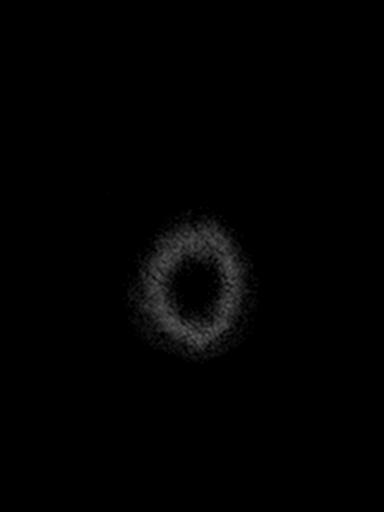

[Series 13: FLAIR · sagittal · 5.0mm · 0.45mm/px · 2 of 27 slices shown (2 of 2)]
[im 1/27]
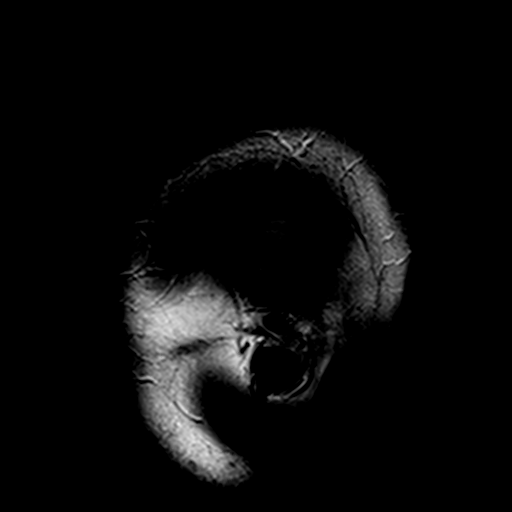
[im 27/27]
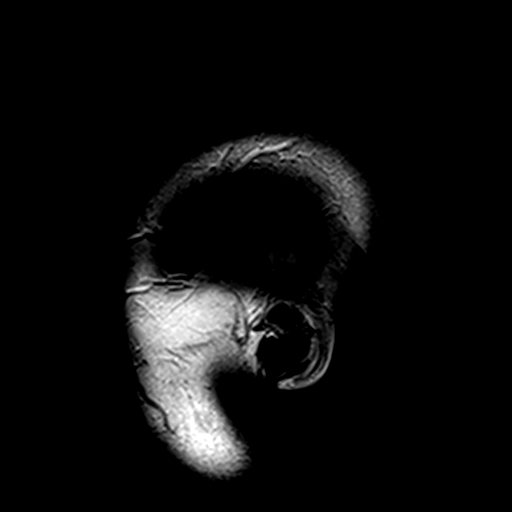

[Series 14: T2 · coronal · 5.0mm · 0.45mm/px · 2 of 32 slices shown (2 of 2)]
[im 1/32]
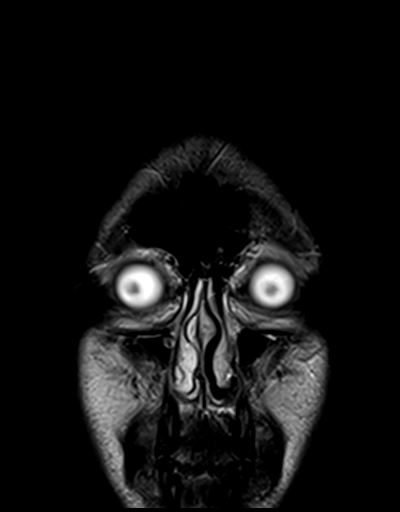
[im 32/32]
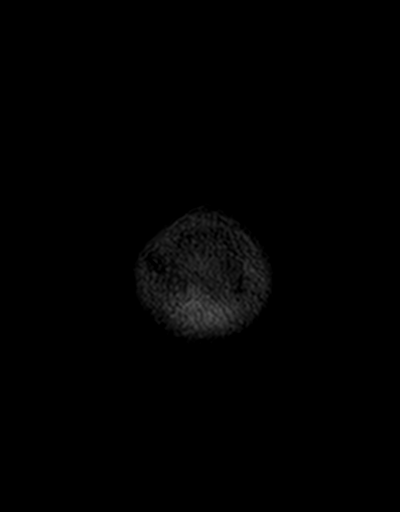

[Series 17: t1_mpr_tra · axial · 1.0mm · 0.45mm/px · z∈[+114,+172]mm · 4 of 160 slices shown (2 of 2)]
[im 1/160]
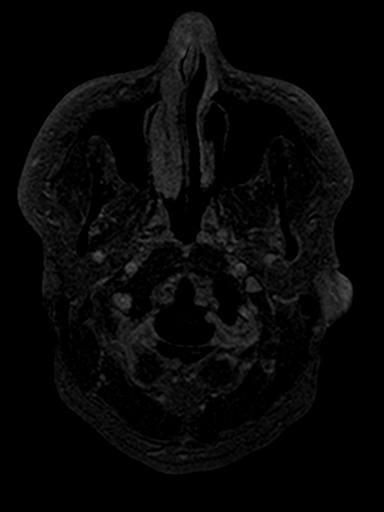
[im 20/160]
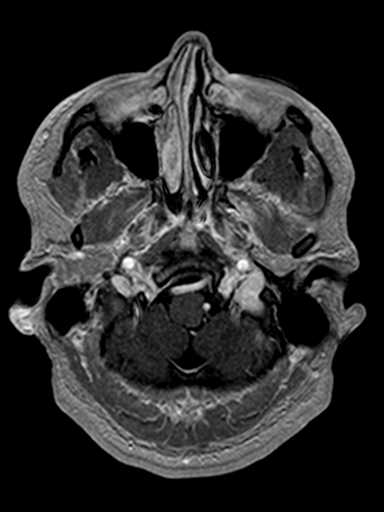
[im 40/160]
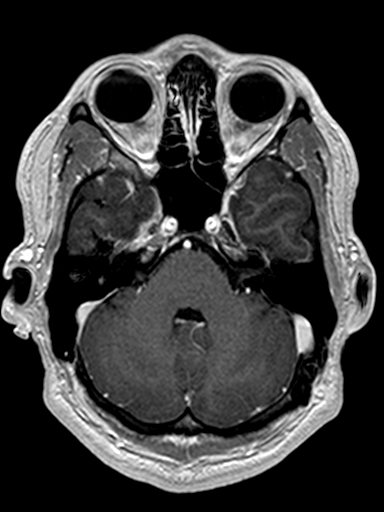
[im 60/160]
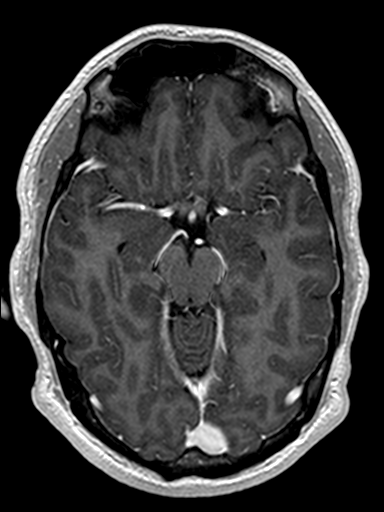

[40 of 48 positions shown; findings below may reference images not displayed]

FINDINGS: Brain: There is no evidence of acute infarct, intracranial
hemorrhage, mass, midline shift, or extra-axial fluid collection.
The ventricles and sulci are normal. The brain is normal in signal.
No abnormal enhancement is identified.

Vascular: Major intracranial vascular flow voids are preserved.

Skull and upper cervical spine: Unremarkable bone marrow signal.

Sinuses/Orbits: Unremarkable orbits.  No significant sinus disease.

Other: None.
IMPRESSION: Negative brain MRI.

## 2019-06-23 IMAGING — MR MR CERVICAL SPINE WO/W CM
6 of 8 series · 29 of 48 positions shown · IV contrast (multihance)
Comparison: Concurrent MRI of the brain.

CLINICAL DATA: 31 y/o M; 1 year lower back pain. Neck pain and
headache and blurred vision. Evaluate for demyelinating disease.

EXAM:
MRI CERVICAL SPINE WITHOUT AND WITH CONTRAST
TECHNIQUE: Multiplanar and multiecho pulse sequences of the cervical spine, to
include the craniocervical junction and cervicothoracic junction,
were obtained without and with intravenous contrast.
CONTRAST:  17mL MULTIHANCE GADOBENATE DIMEGLUMINE 529 MG/ML IV SOLN

[Series 4: STIR · sagittal · 3.0mm · 0.90mm/px · 2 of 15 slices shown]
[im 1/15]
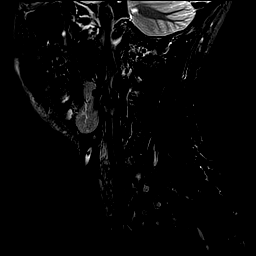
[im 8/15]
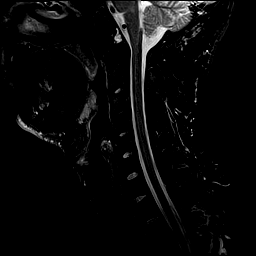

[Series 5: T1 · sagittal · 3.0mm · 0.45mm/px · 3 of 15 slices shown (1 of 2)]
[im 1/15]
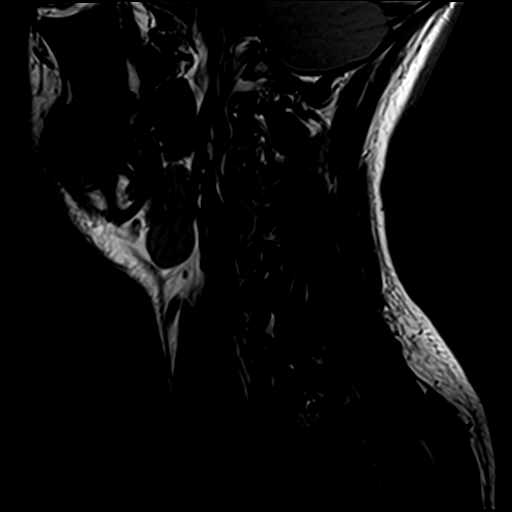
[im 8/15]
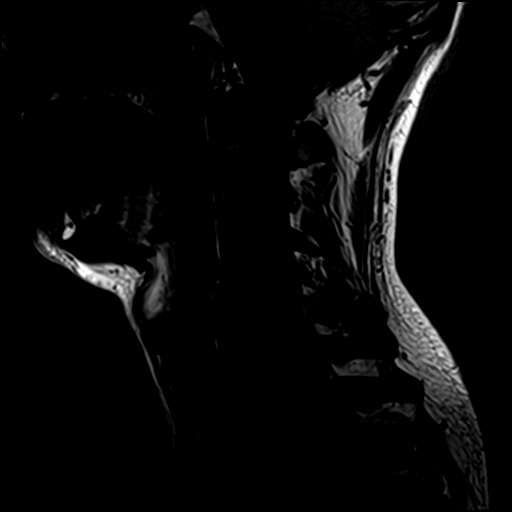
[im 15/15]
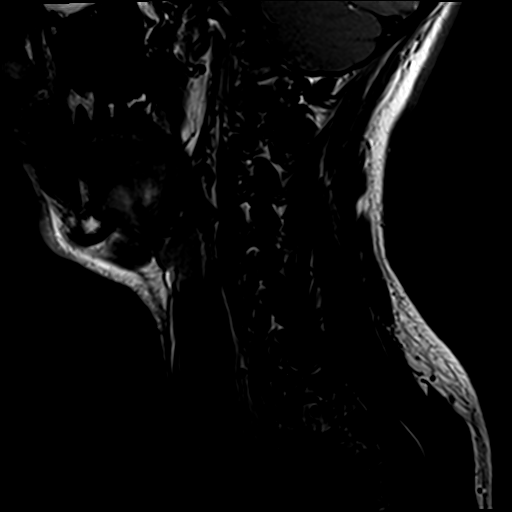

[Series 7: T2 · axial · 3.0mm · 0.70mm/px · z∈[-255,-115]mm · 9 of 39 slices shown (1 of 2)]
[im 1/39]
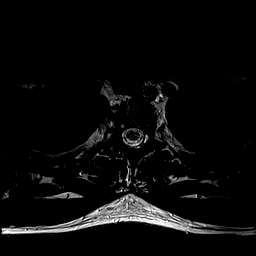
[im 5/39]
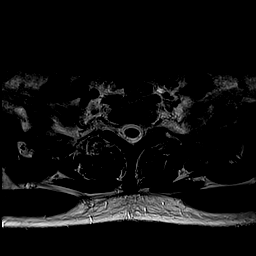
[im 10/39]
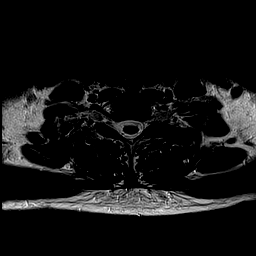
[im 15/39]
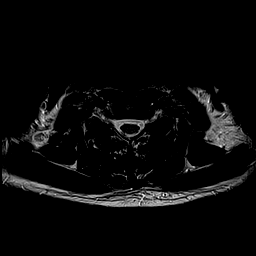
[im 20/39]
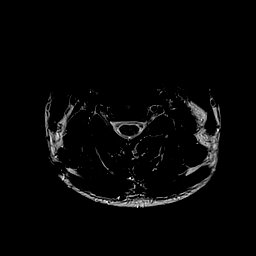
[im 24/39]
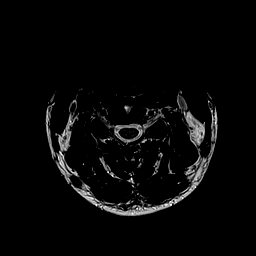
[im 29/39]
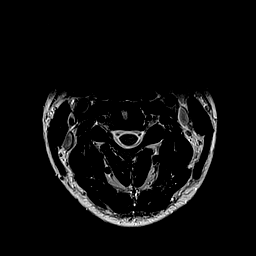
[im 34/39]
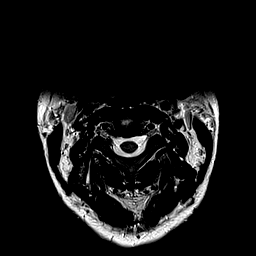
[im 39/39]
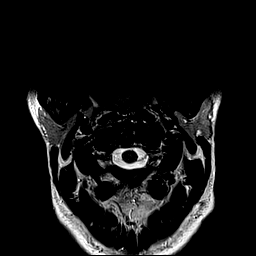

[Series 8: T1 · axial · 3.0mm · 0.35mm/px · z∈[-255,-115]mm · 9 of 39 slices shown (2 of 2)]
[im 1/39]
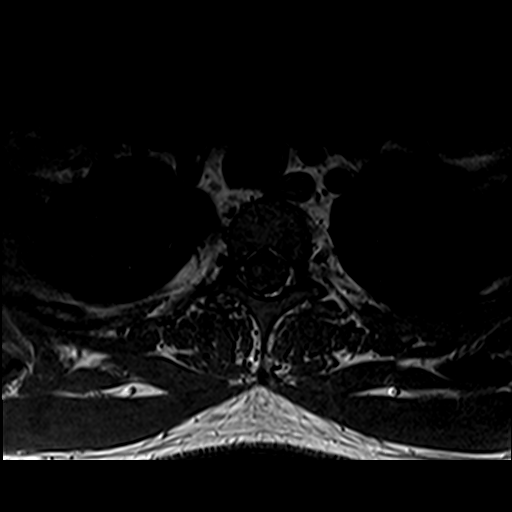
[im 5/39]
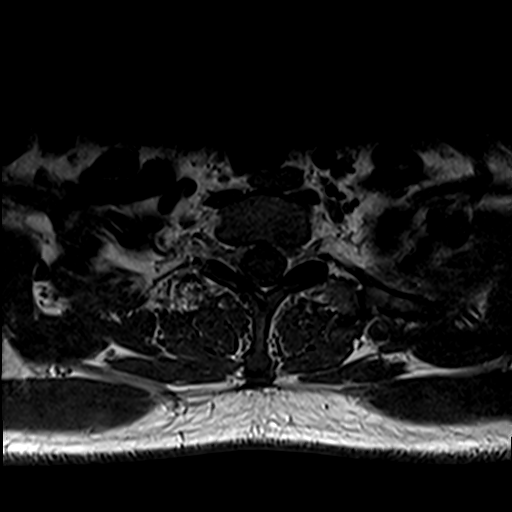
[im 10/39]
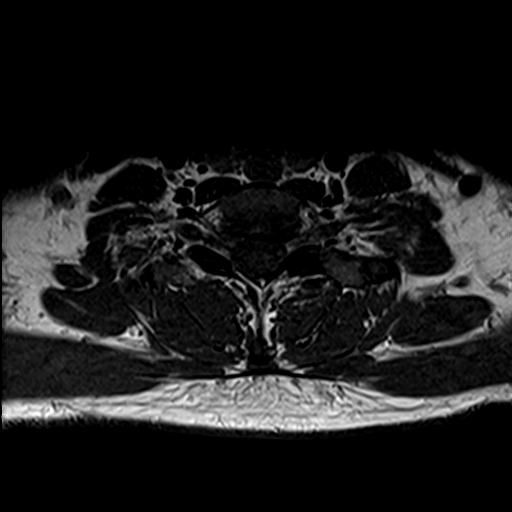
[im 15/39]
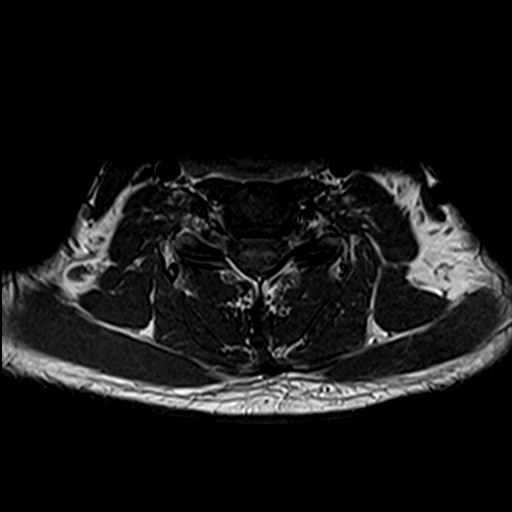
[im 20/39]
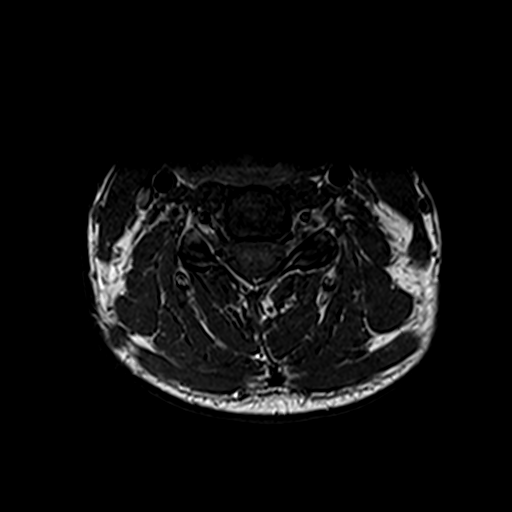
[im 24/39]
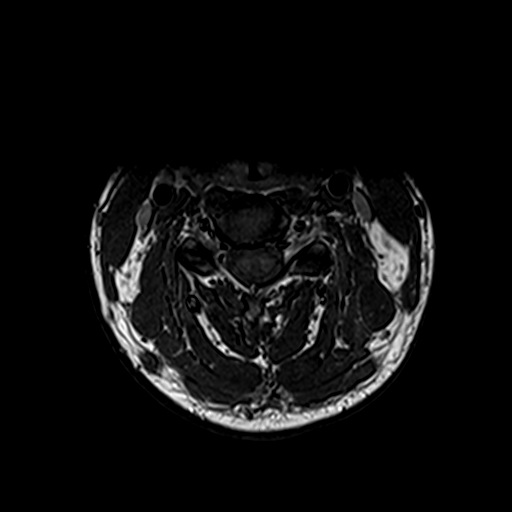
[im 29/39]
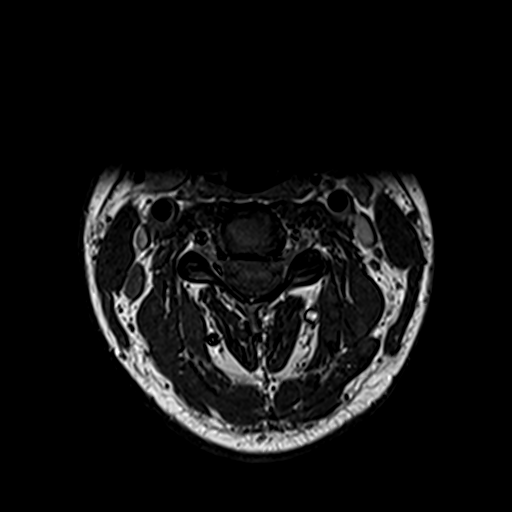
[im 34/39]
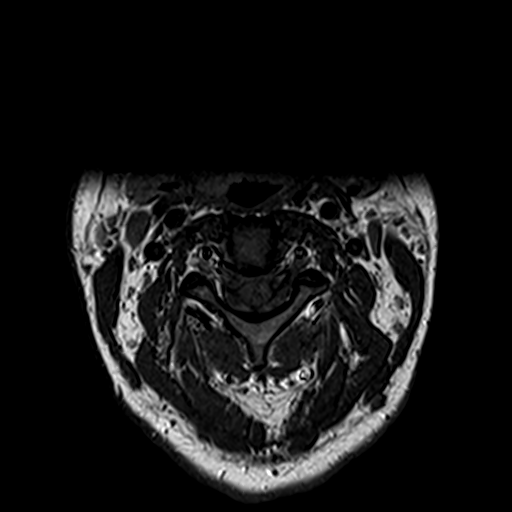
[im 39/39]
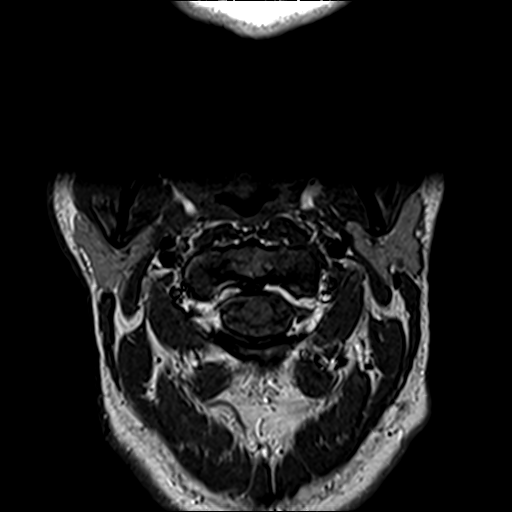

[Series 9: T2 · sagittal · 3.0mm · 0.45mm/px · 3 of 15 slices shown (2 of 2)]
[im 1/15]
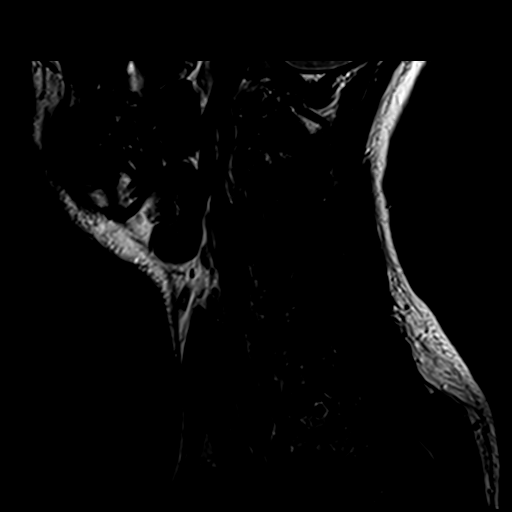
[im 8/15]
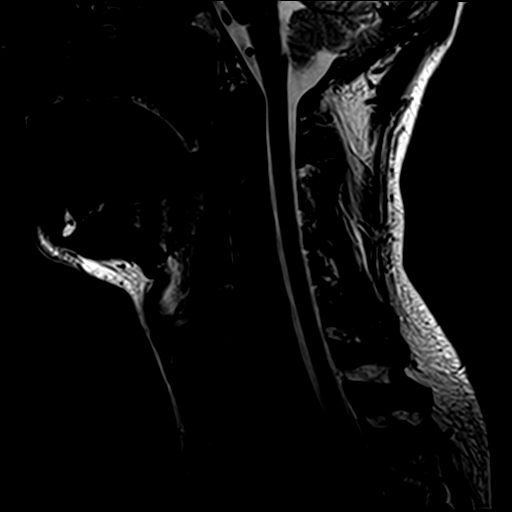
[im 15/15]
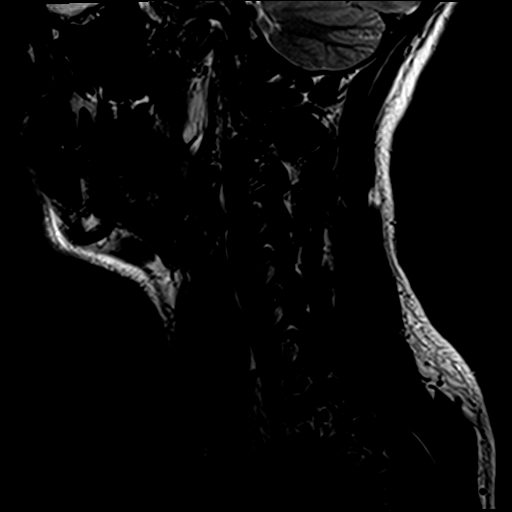

[Series 10: T1 fat-sat post-contrast · sagittal · 3.0mm · 0.90mm/px · 3 of 15 slices shown]
[im 1/15]
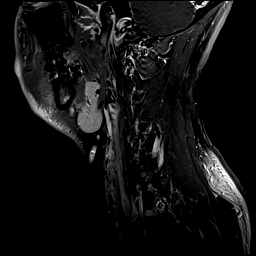
[im 8/15]
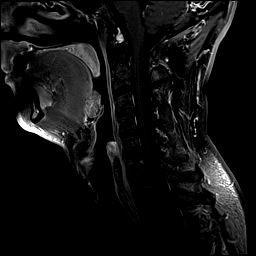
[im 15/15]
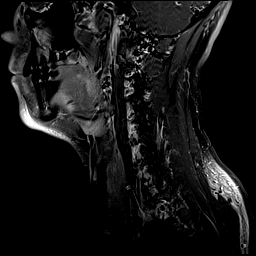

[29 of 48 positions shown; findings below may reference images not displayed]

FINDINGS: Alignment: Physiologic.

Vertebrae: No fracture, evidence of discitis, or bone lesion. No
abnormal enhancement.

Cord: Normal signal and morphology.  No abnormal enhancement.

Posterior Fossa, vertebral arteries, paraspinal tissues: Negative.

Disc levels:

C2-3: No significant disc displacement, foraminal stenosis, or canal
stenosis.

C3-4: No significant disc displacement, foraminal stenosis, or canal
stenosis.

C4-5: No significant disc displacement, foraminal stenosis, or canal
stenosis.

C5-6: No significant disc displacement, foraminal stenosis, or canal
stenosis.

C6-7: No significant disc displacement, foraminal stenosis, or canal
stenosis.

C7-T1: No significant disc displacement, foraminal stenosis, or
canal stenosis.
IMPRESSION: 1. No abnormal cord signal or enhancement.
2. No significant cervical spine degenerative changes.

By: Marianelis Balcaza M.D.
